# Patient Record
Sex: Male | Born: 1955 | Race: White | Hispanic: No | Marital: Married | Smoking: Never smoker
Health system: Southern US, Community
[De-identification: ages and names within clinical notes are randomized; demographics above are authoritative.]

## PROBLEM LIST (undated history)

## (undated) DIAGNOSIS — E785 Hyperlipidemia, unspecified: Secondary | ICD-10-CM

## (undated) DIAGNOSIS — F900 Attention-deficit hyperactivity disorder, predominantly inattentive type: Secondary | ICD-10-CM

## (undated) DIAGNOSIS — J339 Nasal polyp, unspecified: Secondary | ICD-10-CM

## (undated) DIAGNOSIS — M1990 Unspecified osteoarthritis, unspecified site: Secondary | ICD-10-CM

## (undated) DIAGNOSIS — F419 Anxiety disorder, unspecified: Secondary | ICD-10-CM

## (undated) HISTORY — DX: Hyperlipidemia, unspecified: E78.5

## (undated) HISTORY — DX: Attention-deficit hyperactivity disorder, predominantly inattentive type: F90.0

## (undated) HISTORY — DX: Nasal polyp, unspecified: J33.9

## (undated) HISTORY — DX: Anxiety disorder, unspecified: F41.9

## (undated) HISTORY — DX: Unspecified osteoarthritis, unspecified site: M19.90

---
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
## 1999-09-03 HISTORY — PX: INGUINAL HERNIA REPAIR: SUR1180

## 2001-11-08 ENCOUNTER — Encounter: Payer: Self-pay | Admitting: Internal Medicine

## 2001-11-30 ENCOUNTER — Ambulatory Visit (HOSPITAL_BASED_OUTPATIENT_CLINIC_OR_DEPARTMENT_OTHER): Admission: RE | Admit: 2001-11-30 | Discharge: 2001-11-30 | Payer: Self-pay | Admitting: Internal Medicine

## 2004-09-02 ENCOUNTER — Ambulatory Visit: Payer: Self-pay | Admitting: Family Medicine

## 2004-11-11 ENCOUNTER — Ambulatory Visit: Payer: Self-pay | Admitting: Internal Medicine

## 2005-05-14 ENCOUNTER — Ambulatory Visit: Payer: Self-pay | Admitting: Internal Medicine

## 2005-05-27 ENCOUNTER — Ambulatory Visit: Payer: Self-pay | Admitting: Internal Medicine

## 2005-08-21 ENCOUNTER — Ambulatory Visit: Payer: Self-pay | Admitting: Internal Medicine

## 2005-11-13 ENCOUNTER — Ambulatory Visit: Payer: Self-pay | Admitting: Internal Medicine

## 2006-05-21 ENCOUNTER — Ambulatory Visit: Payer: Self-pay | Admitting: Internal Medicine

## 2006-08-07 ENCOUNTER — Emergency Department (HOSPITAL_COMMUNITY): Admission: EM | Admit: 2006-08-07 | Discharge: 2006-08-07 | Payer: Self-pay | Admitting: Family Medicine

## 2006-10-28 ENCOUNTER — Ambulatory Visit: Payer: Self-pay | Admitting: Internal Medicine

## 2006-11-25 ENCOUNTER — Ambulatory Visit: Payer: Self-pay | Admitting: Internal Medicine

## 2006-11-25 LAB — CONVERTED CEMR LAB
Cholesterol: 250 mg/dL (ref 0–200)
Direct LDL: 172.6 mg/dL
Glucose, Bld: 93 mg/dL (ref 70–99)
HDL: 50.3 mg/dL (ref >39.0)
PSA: 1.85 ng/mL (ref 0.10–4.00)
Total CHOL/HDL Ratio: 5
Triglycerides: 119 mg/dL (ref 0–149)
VLDL: 24 mg/dL (ref 0–40)

## 2007-04-13 ENCOUNTER — Telehealth (INDEPENDENT_AMBULATORY_CARE_PROVIDER_SITE_OTHER): Payer: Self-pay | Admitting: *Deleted

## 2007-05-23 DIAGNOSIS — F411 Generalized anxiety disorder: Secondary | ICD-10-CM | POA: Insufficient documentation

## 2007-05-23 DIAGNOSIS — M129 Arthropathy, unspecified: Secondary | ICD-10-CM

## 2007-05-23 DIAGNOSIS — M1990 Unspecified osteoarthritis, unspecified site: Secondary | ICD-10-CM | POA: Insufficient documentation

## 2007-05-23 DIAGNOSIS — F988 Other specified behavioral and emotional disorders with onset usually occurring in childhood and adolescence: Secondary | ICD-10-CM | POA: Insufficient documentation

## 2007-05-23 DIAGNOSIS — J330 Polyp of nasal cavity: Secondary | ICD-10-CM | POA: Insufficient documentation

## 2007-07-11 ENCOUNTER — Telehealth (INDEPENDENT_AMBULATORY_CARE_PROVIDER_SITE_OTHER): Payer: Self-pay | Admitting: *Deleted

## 2007-07-20 ENCOUNTER — Ambulatory Visit: Payer: Self-pay | Admitting: Internal Medicine

## 2007-07-20 DIAGNOSIS — J019 Acute sinusitis, unspecified: Secondary | ICD-10-CM

## 2007-08-29 ENCOUNTER — Telehealth (INDEPENDENT_AMBULATORY_CARE_PROVIDER_SITE_OTHER): Payer: Self-pay | Admitting: *Deleted

## 2007-10-28 ENCOUNTER — Ambulatory Visit: Payer: Self-pay | Admitting: Family Medicine

## 2007-11-28 ENCOUNTER — Telehealth (INDEPENDENT_AMBULATORY_CARE_PROVIDER_SITE_OTHER): Payer: Self-pay | Admitting: *Deleted

## 2007-12-05 ENCOUNTER — Ambulatory Visit: Payer: Self-pay | Admitting: Internal Medicine

## 2007-12-06 LAB — CONVERTED CEMR LAB
Cholesterol: 230 mg/dL (ref 0–200)
Direct LDL: 158.3 mg/dL
Glucose, Bld: 98 mg/dL (ref 70–99)
HDL: 54.5 mg/dL (ref >39.0)
PSA: 2.46 ng/mL (ref 0.10–4.00)
Total CHOL/HDL Ratio: 4.2
Triglycerides: 85 mg/dL (ref 0–149)
VLDL: 17 mg/dL (ref 0–40)

## 2008-03-05 ENCOUNTER — Telehealth (INDEPENDENT_AMBULATORY_CARE_PROVIDER_SITE_OTHER): Payer: Self-pay | Admitting: *Deleted

## 2008-06-06 ENCOUNTER — Telehealth (INDEPENDENT_AMBULATORY_CARE_PROVIDER_SITE_OTHER): Payer: Self-pay | Admitting: *Deleted

## 2008-09-05 ENCOUNTER — Telehealth: Payer: Self-pay | Admitting: Internal Medicine

## 2008-09-24 ENCOUNTER — Ambulatory Visit: Payer: Self-pay | Admitting: Family Medicine

## 2008-09-24 DIAGNOSIS — J069 Acute upper respiratory infection, unspecified: Secondary | ICD-10-CM | POA: Insufficient documentation

## 2008-10-19 ENCOUNTER — Ambulatory Visit: Payer: Self-pay | Admitting: Family Medicine

## 2008-11-13 ENCOUNTER — Ambulatory Visit: Payer: Self-pay | Admitting: Internal Medicine

## 2008-12-04 ENCOUNTER — Telehealth: Payer: Self-pay | Admitting: Internal Medicine

## 2008-12-21 ENCOUNTER — Telehealth: Payer: Self-pay | Admitting: Family Medicine

## 2008-12-28 ENCOUNTER — Telehealth: Payer: Self-pay | Admitting: Internal Medicine

## 2009-03-06 ENCOUNTER — Telehealth: Payer: Self-pay | Admitting: Internal Medicine

## 2009-05-28 ENCOUNTER — Telehealth: Payer: Self-pay | Admitting: Internal Medicine

## 2009-07-17 ENCOUNTER — Ambulatory Visit: Payer: Self-pay | Admitting: Family Medicine

## 2009-10-13 ENCOUNTER — Telehealth: Payer: Self-pay | Admitting: Family Medicine

## 2010-01-17 ENCOUNTER — Ambulatory Visit: Payer: Self-pay | Admitting: Internal Medicine

## 2010-01-17 ENCOUNTER — Telehealth: Payer: Self-pay | Admitting: Internal Medicine

## 2010-05-13 ENCOUNTER — Telehealth (INDEPENDENT_AMBULATORY_CARE_PROVIDER_SITE_OTHER): Payer: Self-pay | Admitting: *Deleted

## 2010-05-14 ENCOUNTER — Ambulatory Visit: Payer: Self-pay | Admitting: Internal Medicine

## 2010-05-15 LAB — CONVERTED CEMR LAB
ALT: 30 units/L (ref 0–53)
AST: 23 units/L (ref 0–37)
Albumin: 3.9 g/dL (ref 3.5–5.2)
Alkaline Phosphatase: 40 units/L (ref 39–117)
BUN: 16 mg/dL (ref 6–23)
Basophils Absolute: 0 10*3/uL (ref 0.0–0.1)
Basophils Relative: 0.6 % (ref 0.0–3.0)
Bilirubin, Direct: 0.1 mg/dL (ref 0.0–0.3)
CO2: 30 meq/L (ref 19–32)
Calcium: 9.2 mg/dL (ref 8.4–10.5)
Chloride: 103 meq/L (ref 96–112)
Cholesterol: 227 mg/dL — ABNORMAL HIGH (ref 0–200)
Creatinine, Ser: 0.9 mg/dL (ref 0.4–1.5)
Direct LDL: 149 mg/dL
Eosinophils Absolute: 0.5 10*3/uL (ref 0.0–0.7)
Eosinophils Relative: 8.1 % — ABNORMAL HIGH (ref 0.0–5.0)
GFR calc non Af Amer: 90.35 mL/min (ref >60)
Glucose, Bld: 90 mg/dL (ref 70–99)
HCT: 45.7 % (ref 39.0–52.0)
HDL: 55.9 mg/dL (ref >39.00)
Hemoglobin: 15.5 g/dL (ref 13.0–17.0)
Lymphocytes Relative: 29 % (ref 12.0–46.0)
Lymphs Abs: 1.9 10*3/uL (ref 0.7–4.0)
MCHC: 34 g/dL (ref 30.0–36.0)
MCV: 92.2 fL (ref 78.0–100.0)
Monocytes Absolute: 0.4 10*3/uL (ref 0.1–1.0)
Monocytes Relative: 5.9 % (ref 3.0–12.0)
Neutro Abs: 3.6 10*3/uL (ref 1.4–7.7)
Neutrophils Relative %: 56.4 % (ref 43.0–77.0)
PSA: 2.08 ng/mL (ref 0.10–4.00)
Phosphorus: 2.9 mg/dL (ref 2.3–4.6)
Platelets: 185 10*3/uL (ref 150.0–400.0)
Potassium: 4.4 meq/L (ref 3.5–5.1)
RBC: 4.96 M/uL (ref 4.22–5.81)
RDW: 12.8 % (ref 11.5–14.6)
Sodium: 140 meq/L (ref 135–145)
TSH: 1.82 microintl units/mL (ref 0.35–5.50)
Total Bilirubin: 0.9 mg/dL (ref 0.3–1.2)
Total CHOL/HDL Ratio: 4
Total Protein: 6.4 g/dL (ref 6.0–8.3)
Triglycerides: 129 mg/dL (ref 0.0–149.0)
VLDL: 25.8 mg/dL (ref 0.0–40.0)
WBC: 6.5 10*3/uL (ref 4.5–10.5)

## 2010-05-22 ENCOUNTER — Ambulatory Visit: Payer: Self-pay | Admitting: Internal Medicine

## 2010-05-22 DIAGNOSIS — B356 Tinea cruris: Secondary | ICD-10-CM

## 2010-05-22 DIAGNOSIS — B351 Tinea unguium: Secondary | ICD-10-CM

## 2010-05-22 DIAGNOSIS — D2370 Other benign neoplasm of skin of unspecified lower limb, including hip: Secondary | ICD-10-CM

## 2010-06-24 ENCOUNTER — Encounter (INDEPENDENT_AMBULATORY_CARE_PROVIDER_SITE_OTHER): Payer: Self-pay | Admitting: *Deleted

## 2010-07-23 ENCOUNTER — Ambulatory Visit: Payer: Self-pay | Admitting: Internal Medicine

## 2010-07-24 LAB — CONVERTED CEMR LAB
ALT: 27 units/L (ref 0–53)
AST: 23 units/L (ref 0–37)
Albumin: 3.8 g/dL (ref 3.5–5.2)
Alkaline Phosphatase: 44 units/L (ref 39–117)
Bilirubin, Direct: 0.1 mg/dL (ref 0.0–0.3)
Total Bilirubin: 0.5 mg/dL (ref 0.3–1.2)
Total Protein: 6.1 g/dL (ref 6.0–8.3)

## 2010-09-19 ENCOUNTER — Encounter: Payer: Self-pay | Admitting: Gastroenterology

## 2010-09-22 ENCOUNTER — Ambulatory Visit: Payer: Self-pay | Admitting: Internal Medicine

## 2010-09-22 DIAGNOSIS — IMO0001 Reserved for inherently not codable concepts without codable children: Secondary | ICD-10-CM | POA: Insufficient documentation

## 2010-09-23 LAB — CONVERTED CEMR LAB
Basophils Absolute: 0 10*3/uL (ref 0.0–0.1)
Basophils Relative: 0.3 % (ref 0.0–3.0)
Eosinophils Absolute: 0.5 10*3/uL (ref 0.0–0.7)
Eosinophils Relative: 6.6 % — ABNORMAL HIGH (ref 0.0–5.0)
HCT: 41.9 % (ref 39.0–52.0)
Hemoglobin: 14.4 g/dL (ref 13.0–17.0)
Lymphocytes Relative: 28.5 % (ref 12.0–46.0)
Lymphs Abs: 2.1 10*3/uL (ref 0.7–4.0)
MCHC: 34.3 g/dL (ref 30.0–36.0)
MCV: 91.3 fL (ref 78.0–100.0)
Monocytes Absolute: 0.4 10*3/uL (ref 0.1–1.0)
Monocytes Relative: 6 % (ref 3.0–12.0)
Neutro Abs: 4.3 10*3/uL (ref 1.4–7.7)
Neutrophils Relative %: 58.6 % (ref 43.0–77.0)
Platelets: 207 10*3/uL (ref 150.0–400.0)
RBC: 4.59 M/uL (ref 4.22–5.81)
RDW: 13.1 % (ref 11.5–14.6)
Sed Rate: 10 mm/hr (ref 0–22)
Total CK: 102 units/L (ref 7–232)
WBC: 7.3 10*3/uL (ref 4.5–10.5)

## 2010-10-21 ENCOUNTER — Encounter (INDEPENDENT_AMBULATORY_CARE_PROVIDER_SITE_OTHER): Payer: Self-pay | Admitting: *Deleted

## 2010-10-24 ENCOUNTER — Ambulatory Visit: Payer: Self-pay | Admitting: Gastroenterology

## 2010-11-04 ENCOUNTER — Ambulatory Visit
Admission: RE | Admit: 2010-11-04 | Discharge: 2010-11-04 | Payer: Self-pay | Source: Home / Self Care | Attending: Family Medicine | Admitting: Family Medicine

## 2010-11-04 ENCOUNTER — Encounter (INDEPENDENT_AMBULATORY_CARE_PROVIDER_SITE_OTHER): Payer: Self-pay | Admitting: *Deleted

## 2010-11-04 ENCOUNTER — Other Ambulatory Visit: Payer: Self-pay | Admitting: Family Medicine

## 2010-11-04 LAB — SEDIMENTATION RATE: Sed Rate: 11 mm/hr (ref 0–22)

## 2010-11-04 LAB — CK: Total CK: 78 U/L (ref 7–232)

## 2010-11-07 ENCOUNTER — Ambulatory Visit: Admit: 2010-11-07 | Payer: Self-pay | Admitting: Gastroenterology

## 2010-11-11 ENCOUNTER — Telehealth: Payer: Self-pay | Admitting: Family Medicine

## 2010-12-02 NOTE — Assessment & Plan Note (Signed)
Summary: YEARLY PHYSICAL/JRR   Vital Signs:  Patient profile:   55 year old male Weight:      198.38 pounds BMI:     28.77 Temp:     98.4 degrees F oral Pulse rate:   72 / minute Pulse rhythm:   regular BP sitting:   106 / 66  (left arm) Cuff size:   large  Vitals Entered By: Sydell Axon LPN (May 22, 2010 2:18 PM) CC: 30 Minute checkup   History of Present Illness: Doing well Has lost 30# by dietary management and aggressive exercise regimen Reviewed his lab work chol is down for him with this regimen  Still fine off his other meds Anxiety has not been a sig problem No work performance issues without the stimulant  Allergies have been okay uses flonase when needed  Allergies: 1)  ! * Pine Nuts  Past History:  Past medical, surgical, family and social histories (including risk factors) reviewed for relevance to current acute and chronic problems.  Past Medical History: Reviewed history from 05/23/2007 and no changes required. Anxiety Hyperlipidemia ADHD--inattentive Osteoarthritis Nasal polyps  Past Surgical History: Reviewed history from 05/23/2007 and no changes required. [facility] (Dr. Reuel Boom) 09/1999 Sleep study, negative 01/03 L knee cartilage removal 1973 Right knee arthroscopy 1974  Family History: Reviewed history from 12/05/2007 and no changes required. Mom and Dad both alcoholic Twin sister and younger sister CAD on both sides No HTN Mat GM had DM No prostate or colon cancer  Social History: Reviewed history from 05/23/2007 and no changes required. Marital Status: Married Children: 3 Occupation: IT sales professional Never Smoked  Review of Systems General:  occ sleep problems--nothing major does note tiredness at times wears seat belt. Eyes:  Denies double vision and vision loss-1 eye. ENT:  Complains of decreased hearing; denies ringing in ears; stable hearing loss teeth fine--regular with dentist. CV:  Denies chest pain or  discomfort, difficulty breathing at night, difficulty breathing while lying down, fainting, lightheadness, palpitations, and shortness of breath with exertion. Resp:  Denies cough and shortness of breath. GI:  Complains of change in bowel habits; denies abdominal pain, bloody stools, dark tarry stools, indigestion, nausea, and vomiting; bowels down to every other day--with reduction in calories. GU:  Denies erectile dysfunction, nocturia, urinary frequency, and urinary hesitancy. MS:  Complains of joint pain; denies joint swelling; chronic knee pain--not limiting. Derm:  Complains of lesion(s) and rash; rash and groin rash in hot weather---used antifungal cream with success in past Still with toenail fungus  Has persistent nodule on left calf that is bothersome. Neuro:  Denies headaches, numbness, tingling, and weakness. Psych:  Denies anxiety and depression. Heme:  Denies abnormal bruising and enlarge lymph nodes. Allergy:  Complains of seasonal allergies and sneezing; meds control reasonably.  Physical Exam  General:  alert and normal appearance.   Eyes:  pupils equal, pupils round, pupils reactive to light, and no optic disk abnormalities.   Ears:  R ear normal and L ear normal.   Mouth:  no erythema, no exudates, and no lesions.   Neck:  supple, no masses, no thyromegaly, no carotid bruits, and no cervical lymphadenopathy.   Lungs:  normal respiratory effort, no intercostal retractions, no accessory muscle use, and normal breath sounds.   Heart:  normal rate, regular rhythm, no murmur, and no gallop.   Abdomen:  soft, non-tender, and no masses.   Rectal:  no hemorrhoids and no masses.   Prostate:  no gland enlargement and no  nodules.   Msk:  no joint tenderness and no joint swelling.   Pulses:  normal in feet Extremities:  no edema Neurologic:  alert & oriented X3, strength normal in all extremities, and gait normal.   Skin:  onchymycosis 7/10 toenails mild crural rash Axillary  Nodes:  No palpable lymphadenopathy Psych:  normally interactive, good eye contact, not anxious appearing, and not depressed appearing.     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 1 tab daily for 3 months for toenail fungus    Ketoconazole 2 % Crea (Ketoconazole) .Marland Kitchen... Apply to rash two times a day till clear  Problem # 4:  ANXIETY (ICD-300.00) Assessment: Improved continues to do well without Rx  Problem # 5:  HYPERLIPIDEMIA, MOD. (ICD-272.4) Assessment: Comment Only great work on lifestyle  Labs Reviewed: SGOT: 23 (05/14/2010)   SGPT: 30 (05/14/2010)   HDL:55.90 (05/14/2010), 54.5 (12/05/2007)  LDL:DEL (12/05/2007), DEL (11/25/2006)  Chol:227 (05/14/2010), 230 (12/05/2007)  Trig:129.0 (05/14/2010), 85 (12/05/2007)  Complete Medication List: 1)  Epipen 2-pak 0.3 Mg/0.63ml (1:1000) Devi (Epinephrine hcl (anaphylaxis)) .... Inject 1 pen subcutaneously as directed 2)  Fluticasone Propionate 50 Mcg/act Susp (Fluticasone propionate) .... 2 sprays each nostril daily 3)  Terbinafine Hcl 250 Mg Tabs (Terbinafine hcl) .Marland Kitchen.. 1 tab daily for 3 months for toenail fungus 4)  Ketoconazole 2 % Crea (Ketoconazole) .... Apply to rash two times a day till clear  Other Orders: Gastroenterology Referral (GI)  Patient Instructions: 1)  Please schedule a follow-up appointment in 1 year.  2)  Please set up blood work in about 1 month (hepatic) 3)  Schedule a colonoscopy/ sigmoidoscopy to help detect colon cancer.  Prescriptions: KETOCONAZOLE 2 % CREA (KETOCONAZOLE) apply to rash  two times a day till clear  #60gm x 3   Entered and Authorized by:   Cindee Salt MD   Signed by:   Cindee Salt MD on 05/22/2010   Method used:   Electronically to        [facility] (retail)       [address]       [place], [place]  [address]       Ph: [phone]       Fax: [phone]   RxID:   [id number] TERBINAFINE HCL 250 MG TABS (TERBINAFINE HCL) 1 tab daily for 3 months for toenail fungus  #30 x 2   Entered and Authorized by:   Cindee Salt MD   Signed by:   Cindee Salt MD on 05/22/2010   Method used:   Electronically to        [facility] (retail)       [address]       [place], [place]  [address]       Ph: [phone]       Fax: [phone]   RxID:   [id number] EPIPEN 2-PAK 0.3 MG/0.3ML (1:1000) DEVI (EPINEPHRINE HCL (ANAPHYLAXIS)) Inject 1 pen subcutaneously  as directed  #2 x 1   Entered by:   Sydell Axon LPN   Authorized by:   Cindee Salt MD   Signed by:   Sydell Axon LPN on 21/30/8657   Method used:   Electronically to        [facility] (retail)       [address]       [place], [place]  [address]       Ph: [phone]       Fax: [phone]   RxID:   [id number]   Current Allergies (reviewed today): ! * PINE NUTS  Appended Document: YEARLY PHYSICAL/JRR     Allergies: 1)  ! * Pine Nuts   Impression & Recommendations:  Problem # 1:  DERMATOFIBROMA, LEG (ICD-216.7) Assessment New  liquid nitrogen treatment 40 seconds x 2 tolerated well  Orders: Cryotherapy/Destruction benign or premalignant lesion (1st lesion)  ([address])  Complete Medication List: 1)  Epipen 2-pak 0.3 Mg/0.69ml (1:1000) Devi (Epinephrine hcl (anaphylaxis)) .... Inject 1 pen subcutaneously as directed 2)  Fluticasone Propionate 50 Mcg/act Susp (Fluticasone propionate) .... 2 sprays  each nostril daily 3)  Terbinafine Hcl 250 Mg Tabs (Terbinafine hcl) .Marland Kitchen.. 1 tab daily for 3 months for toenail fungus 4)  Ketoconazole 2 % Crea (Ketoconazole) .... Apply to rash two times a day till clear

## 2010-12-02 NOTE — Letter (Signed)
Summary: [place] No Show Letter  [place] at [facility]  [address] [place], [place] [address]   Phone: [phone]  Fax: [phone]    06/24/2010 MRN: [id number]  Alexander Cohen [address] [place], [place]  [address]   Dear Alexander Cohen,   Our records indicate that you missed your scheduled appointment with ____lab_________________ on _8.22.11__________.  Please contact this office to reschedule your appointment as soon as possible.  It is important that you keep your scheduled appointments with your physician, so we can provide you the best care possible.  Please be advised that there may be a charge for "no show" appointments.    Sincerely,   [place] at [facility]

## 2010-12-02 NOTE — Progress Notes (Signed)
Summary: Need Rx's sent to Pharmacy  Phone Note Call from Patient   Caller: Patient Call For: Alexander Salt MD Summary of Call: Pt left Rx in the office today, need them sent to [facility] in [place]... Augmentin-Pot Clavalunate875-125mg .Daine Gip  January 17, 2010 2:51 PM Initial call taken by: Daine Gip,  January 17, 2010 2:51 PM  Follow-up for Phone Call        rx sent to pharmacy Follow-up by: Mervin Hack CMA Duncan Dull),  January 17, 2010 3:10 PM    Prescriptions: AMOXICILLIN-POT CLAVULANATE 875-125 MG TABS (AMOXICILLIN-POT CLAVULANATE) 1 by mouth two times a day with food for sinus infection  #20 x 1   Entered by:   Mervin Hack CMA (AAMA)   Authorized by:   Alexander Salt MD   Signed by:   Mervin Hack CMA (AAMA) on 01/17/2010   Method used:   Electronically to        [facility] (retail)       [address]       [place], [place]  [address]       Ph: [phone]       Fax: [phone]   RxID:   [id number]

## 2010-12-02 NOTE — Assessment & Plan Note (Signed)
Summary: UPPER ARMS,HIP PAIN/CLE   Vital Signs:  Patient profile:   55 year old male Weight:      191 pounds Temp:     97.7 degrees F oral BP sitting:   108 / 60  (left arm) Cuff size:   large  Vitals Entered By: Mervin Hack CMA Duncan Dull) (September 22, 2010 12:45 PM) CC: shoulder and hip pain x1week   History of Present Illness: In "terrible pain" Arms by shoulders and in hips Hard to even gets his shirts on  doesn't remember any injury No loss of strength but pain is limiting trying to continue his regular regimen  affecting his sleep Along lateral hips also Goes back 1-2 weeks  Has used ibuprofen but it really hasn't helped tried cream without effect slight help with hand held massager  Has been doing daily computer generated work out using weights daily but now stopped over the past 3 days also had been raking leaves  Allergies: 1)  ! * Pine Nuts  Past History:  Past medical, surgical, family and social histories (including risk factors) reviewed for relevance to current acute and chronic problems.  Past Medical History: Reviewed history from 05/23/2007 and no changes required. Anxiety Hyperlipidemia ADHD--inattentive Osteoarthritis Nasal polyps  Past Surgical History: Reviewed history from 05/23/2007 and no changes required. [facility] (Dr. Reuel Boom) 09/1999 Sleep study, negative 01/03 L knee cartilage removal 1973 Right knee arthroscopy 1974  Family History: Reviewed history from 12/05/2007 and no changes required. Mom and Dad both alcoholic Twin sister and younger sister CAD on both sides No HTN Mat GM had DM No prostate or colon cancer  Social History: Reviewed history from 05/23/2007 and no changes required. 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  NAD Head:  no temporal bruits Neck:  supple,  full ROM, and no masses.   Msk:  No tenderness or decrease in ROM at shoulders sensitive around triceps tendon area  hips have normal ROM no localized tenderness Neurologic:  alert & oriented X3, strength normal in all extremities, and gait normal.   Psych:  normally interactive, good eye contact, not anxious appearing, and not depressed appearing.     Impression & Recommendations:  Problem # 1:  MYALGIA (ICD-729.1) Assessment New  pattern concerning for PMR but he is on the young side may just have had strain from hard work out protocol  P: will just do cardio for now     tramadol to help sleep     check labs---consider prednisone trial if ESR elevated and no better in 1-2 weeks  His updated medication list for this problem includes:    Tramadol Hcl 50 Mg Tabs (Tramadol hcl) .Marland Kitchen... 1-2 tabs at bedtime as needed for severe pain  Orders: Venipuncture ([address]) TLB-CBC Platelet - w/Differential (85025-CBCD) TLB-CK Total Only(Creatine Kinase/CPK) (82550-CK) TLB-Sedimentation Rate (ESR) (85652-ESR)  Complete Medication List: 1)  Epipen 2-pak 0.3 Mg/0.75ml (1:1000) Devi (Epinephrine hcl (anaphylaxis)) .... Inject 1 pen subcutaneously as directed 2)  Fluticasone Propionate 50 Mcg/act Susp (Fluticasone propionate) .... 2 sprays each nostril daily 3)  Tramadol Hcl 50 Mg Tabs (Tramadol hcl) .Marland Kitchen.. 1-2 tabs at bedtime as needed for severe pain  Patient Instructions: 1)  Please call if the pain in your muscles is no better in 1-2 weeks 2)  Please schedule a follow-up appointment as needed .  Prescriptions:  TRAMADOL HCL 50 MG TABS (TRAMADOL HCL) 1-2 tabs at bedtime as needed for severe pain  #30 x 0   Entered and Authorized by:   Cindee Salt MD   Signed by:   Cindee Salt MD on 09/22/2010   Method used:   Electronically to        [facility] (retail)       [address]       [place], [place]  [address]       Ph:  [phone]       Fax: [phone]   RxID:   [id number]    Orders Added: 1)  Est. Patient Level IV [[address]] 2)  Venipuncture [[address]] 3)  TLB-CBC Platelet - w/Differential [85025-CBCD] 4)  TLB-CK Total Only(Creatine Kinase/CPK) [82550-CK] 5)  TLB-Sedimentation Rate (ESR) [85652-ESR]    Current Allergies (reviewed today): ! * PINE NUTS

## 2010-12-02 NOTE — Progress Notes (Signed)
----   Converted from flag ---- ---- 05/13/2010 10:34 AM, Cindee Salt MD wrote: Renal, CBC with diff, TSH---314.00 lipid, hepatic   -- 272.4 PSA--v76.44  ---- 05/13/2010 7:53 AM, Liane Comber CMA (AAMA) wrote: Pt is scheduled for cpx labs tomorrow, what labs to draw and dx codes? Thanks Tasha ------------------------------

## 2010-12-02 NOTE — Letter (Signed)
Summary: Pre Visit Letter Revised  [facility]  [address] [place], [place] [address]   Phone: [phone]  Fax: [phone]        09/19/2010 MRN: [id number]  Alexander Cohen [address] [place], [place]  [address]             Procedure Date:  11-07-10 8:30am           Dr  Christella Hartigan   Welcome to the Gastroenterology Division at [facility].    You are scheduled to see a nurse for your pre-procedure visit on 10-24-10 at 8:30am on the 3rd floor at [facility], [address]  We ask that you try to arrive at our office 15 minutes prior to your appointment time to allow for check-in.  Please take a minute to review the attached form.  If you answer "Yes" to one or more of the questions on the first page, we ask that you call the person listed at your earliest opportunity.  If you answer "No" to all of the questions, please complete the rest of the form and bring it to your appointment.    Your nurse visit will consist of discussing your medical and surgical history, your immediate family medical history, and your medications.   If you are unable to list all of your medications on the form, please bring the medication bottles to your appointment and we will list them.  We will need to be aware of both prescribed and over the counter drugs.  We will need to know exact dosage information as well.    Please be prepared to read and sign documents such as consent forms, a financial agreement, and acknowledgement forms.  If necessary, and with your consent, a friend or relative is welcome to sit-in on the nurse visit with you.  Please bring your insurance card so that we may make a copy of it.  If your insurance requires a referral to see a specialist, please bring your referral form from your primary care physician.  No co-pay is required for this nurse visit.     If you cannot keep your appointment, please call [phone] to cancel or reschedule prior to your  appointment date.  This allows [place] the opportunity to schedule an appointment for another patient in need of care.    Thank you for choosing [facility] for your medical needs.  We appreciate the opportunity to care for you.  Please visit [place] at our website  to learn more about our practice.  Sincerely, The Gastroenterology Division

## 2010-12-02 NOTE — Assessment & Plan Note (Signed)
Summary: CONGESTION,ST,DRAINAGE/CLE   Vital Signs:  Patient profile:   55 year old male Weight:      218 pounds O2 Sat:      97 % on Room air Temp:     98.3 degrees F oral Pulse rate:   97 / minute Pulse rhythm:   regular BP sitting:   110 / 62  (left arm) Cuff size:   large  Vitals Entered By: Mervin Hack CMA Duncan Dull) (January 17, 2010 12:34 PM)  O2 Flow:  Room air CC: congestion   History of Present Illness: Overall doing okay Stopped both buspar and concerta last summer--has done well  No job performance issues mild anxiety but nothing worrisome  sinus symtoms sore throat started bad yesterday Tried decongestant nasal spray---uses very sparingly with congestion Not really helping lots of illness around school  some facial pressure no fever Lots of PND cough with dark heavy mucus  Allergies: 1)  ! * Pine Nuts  Past History:  Past medical, surgical, family and social histories (including risk factors) reviewed for relevance to current acute and chronic problems.  Past Medical History: Reviewed history from 05/23/2007 and no changes required. Anxiety Hyperlipidemia ADHD--inattentive Osteoarthritis Nasal polyps  Past Surgical History: Reviewed history from 05/23/2007 and no changes required. [facility] (Dr. Reuel Boom) 09/1999 Sleep study, negative 01/03 L knee cartilage removal 1973 Right knee arthroscopy 1974  Family History: Reviewed history from 12/05/2007 and no changes required. Mom and Dad both alcoholic Twin sister and younger sister CAD on both sides No HTN Mat GM had DM No prostate or colon cancer  Social History: Reviewed history from 05/23/2007 and no changes required. 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  NAD Head:  no frontal or maxillary tenderness Ears:  R  ear normal and L ear normal.   Nose:  marked congestion with thick yellowish mucus--esp on right. ?small polyp on right Mouth:  no erythema and no exudates.   Neck:  supple, no masses, no thyromegaly, and no cervical lymphadenopathy.   Lungs:  normal respiratory effort, normal breath sounds, no crackles, and no wheezes.   Psych:  normally interactive, good eye contact, not anxious appearing, and not depressed appearing.     Impression & Recommendations:  Problem # 1:  SINUSITIS- ACUTE-NOS (ICD-461.9) Assessment Comment Only early in course discussed restarting his flonase if worsens, will start augmentin  Problem # 2:  ATTENTION DEFICIT DISORDER, INATTENTIVE TYPE (ICD-314.00) Assessment: Improved dong fine without the meds  Problem # 3:  ANXIETY (ICD-300.00) Assessment: Comment Only mild symptoms only without meds now  Complete Medication List: 1)  Epipen 2-pak 0.3 Mg/0.39ml (1:1000) Devi (Epinephrine hcl (anaphylaxis)) .... Inject 1 pen subcutaneously as directed 2)  Ambien 10 Mg Tabs (Zolpidem tartrate) .... Take 1 tablet by mouth at bedtime as needed. 3)  Fluticasone Propionate 50 Mcg/act Susp (Fluticasone propionate) .... 2 sprays each nostril daily 4)  Amoxicillin-pot Clavulanate 875-125 Mg Tabs (Amoxicillin-pot clavulanate) .Marland Kitchen.. 1 by mouth two times a day with food for sinus infection  Patient Instructions: 1)  Please schedule a follow-up appointment in 3-6  months for physical Prescriptions: AMOXICILLIN-POT CLAVULANATE 875-125 MG TABS (AMOXICILLIN-POT CLAVULANATE) 1 by mouth two times a day with food for sinus infection  #20 x 1   Entered and Authorized by:   Cindee Salt MD  Signed by:   Cindee Salt MD on 01/17/2010   Method used:   Print then Give to Patient   RxID:   [id number] FLUTICASONE PROPIONATE 50 MCG/ACT SUSP (FLUTICASONE PROPIONATE) 2 sprays each nostril daily  #1 x 12   Entered and Authorized by:   Cindee Salt MD   Signed by:    Cindee Salt MD on 01/17/2010   Method used:   Electronically to        [facility] (retail)       [address]       [place], [place]  [address]       Ph: [phone]       Fax: [phone]   RxID:   [id number] FLONASE 50 MCG/ACT  SUSP (FLUTICASONE PROPIONATE) Use two  sprays  in each nostril daily  #1 x 11   Entered by:   Mervin Hack CMA (AAMA)   Authorized by:   Cindee Salt MD   Signed by:   Mervin Hack CMA (AAMA) on 01/17/2010   Method used:   Electronically to        [facility] (retail)       [address]       [place], [place]  [address]       Ph: [phone]       Fax: [phone]   RxID:   [id number]   Current Allergies (reviewed today): ! * PINE NUTS

## 2010-12-04 NOTE — Assessment & Plan Note (Signed)
Summary: PAIN IN SHOULDERS AND LEGS/[facility]   Vital Signs:  Patient profile:   55 year old male Weight:      193.25 pounds Temp:     98.1 degrees F oral Pulse rate:   64 / minute Pulse rhythm:   regular BP sitting:   110 / 70  (right arm) Cuff size:   large  Vitals Entered By: Sydell Axon LPN (November 04, 2010 2:06 PM) CC: Pain in shoulders and legs   History of Present Illness: Similar to 11/11 symptoms.  ESR was not elevated.  Was not put on prednisone but did take the tramadol.  Backed off exercise in the meantime and pain has increased.  Strength is good in arms but has pain proximally.  Similar distribution in legs.  No help with aleve two times a day.  Tramadol didn't help at all.  "It's not in the joint."  He isn't weak.  No vision changes.  No HA.  No FCNAVDR or other systemic symptoms.  prev notes reviewed.   Allergies: 1)  ! * Pine Nuts  Social History: Reviewed history from 05/23/2007 and no changes required. 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  Otherwise negative.    Physical Exam  General:  no apparent distress normocephalic atraumatic mucous membranes moist neck supple regular rate and rhythm clear to auscultation bilaterally diffusely tender to palpation and with testing of proximal limb muscles, but not weak. dtrs wnl x4 and sensation intact.     Impression & Recommendations:  Problem # 1:  MYALGIA (ICD-729.1) Presumed PMR.  Will check labs and start prednisone.  See instructions.  I would tx with prednisone even if ESR is not elevated.  Pt understands and will call back with update.  We can determine steroid taper at that point..  No HA and no vision changes.  The following medications were removed from the medication list:    Tramadol Hcl 50 Mg Tabs (Tramadol hcl) .Marland Kitchen... 1-2 tabs at bedtime as needed for severe pain  Orders: Prescription Created Electronically  [phone]) Venipuncture ([address]) Specimen Handling ([address]) TLB-CK Total Only(Creatine Kinase/CPK) (82550-CK) TLB-Sedimentation Rate (ESR) (85652-ESR)  Complete Medication List: 1)  Epipen 2-pak 0.3 Mg/0.16ml (1:1000) Devi (Epinephrine hcl (anaphylaxis)) .... Inject 1 pen subcutaneously as directed 2)  Fluticasone Propionate 50 Mcg/act Susp (Fluticasone propionate) .... 2 sprays each nostril daily 3)  Moviprep 100 Gm Solr (Peg-kcl-nacl-nasulf-na asc-c) .... As per prep instructions. 4)  Prednisone 20 Mg Tabs (Prednisone) .Marland Kitchen.. 1 by mouth once daily with food.  Patient Instructions: 1)  I would start the prednisone today.  Take it with food.  Let us know in a few days if you are getting much better (or if you are getting worse).  If you don't have much of a change by next week, let us know.  We'll contact you with your lab report.  Prescriptions: PREDNISONE 20 MG TABS (PREDNISONE) 1 by mouth once daily with food.  #14 x 0   Entered and Authorized by:   Crawford Givens MD   Signed by:   Crawford Givens MD on 11/04/2010   Method used:   Electronically to        [facility] (retail)       [address]       [place], [place]  [address]  Ph: [phone]       Fax: [phone]   RxID:   [id number]    Orders Added: 1)  Est. Patient Level III [87564] 2)  Prescription Created Electronically [G8553] 3)  Venipuncture [33295] 4)  Specimen Handling [99000] 5)  TLB-CK Total Only(Creatine Kinase/CPK) [82550-CK] 6)  TLB-Sedimentation Rate (ESR) [85652-ESR]    Current Allergies (reviewed today): ! * PINE NUTS

## 2010-12-04 NOTE — Miscellaneous (Signed)
Summary: [facility]  Clinical Lists Changes  Medications: Added new medication of MOVIPREP 100 GM  SOLR (PEG-KCL-NACL-NASULF-NA ASC-C) As per prep instructions. - Signed Rx of MOVIPREP 100 GM  SOLR (PEG-KCL-NACL-NASULF-NA ASC-C) As per prep instructions.;  #1 x 0;  Signed;  Entered by: Durwin Glaze RN;  Authorized by: Rachael Fee MD;  Method used: Electronically to [facility] [address], [place], [place], [place]  [address], Ph: [phone], Fax: [phone] Observations: Added new observation of ALLERGY REV: Done (10/24/2010 8:26)    Prescriptions: MOVIPREP 100 GM  SOLR (PEG-KCL-NACL-NASULF-NA ASC-C) As per prep instructions.  #1 x 0   Entered by:   Durwin Glaze RN   Authorized by:   Rachael Fee MD   Signed by:   Durwin Glaze RN on 10/24/2010   Method used:   Electronically to        [facility] (retail)       [address]       [place], [place]  [address]       Ph: [phone]       Fax: [phone]   RxID:   [id number]

## 2010-12-04 NOTE — Letter (Signed)
Summary: Candi Leash and No Reschedule  [facility]  [address] [place], [place] [address]   Phone: [phone]  Fax: [phone]      November 04, 2010 MRN: [id number]   MATAIO MELE [address] [place], [place]  [address]     You recently cancelled your colonoscopy procedure at the [facility] and did not reschedule for another date.    Your provider recommended this procedure for the benefit of your health.  It is very important that you reschedule it.  Failure to do so may be to the detriment of your health.  Please call us at [phone] and we will be happy to assist you with rescheduling.    If you were referred for this procedure by another physician/provider, we will notify him/her that you did not keep your appointment.   Sincerely,  [facility]

## 2010-12-04 NOTE — Letter (Signed)
Summary: [facility] Instructions  Alexander Cohen  [address] [place], [place] [address]   Phone: [phone]  Fax: [phone]       Alexander Cohen    07-13-1956    MRN: [id number]        Procedure Day [name]:  Alexander Cohen  [birth year]     Arrival Time:  7:30am     Procedure Time:  8:30am     Location of Procedure:                    Alexander Cohen  [facility] (4th Floor)                       PREPARATION FOR COLONOSCOPY WITH MOVIPREP   Starting 5 days prior to your procedure  SUNDAY 01/01  do not eat nuts, seeds, popcorn, corn, beans, peas,  salads, or any raw vegetables.  Do not take any fiber supplements (e.g. Metamucil, Citrucel, and Benefiber).  THE DAY BEFORE YOUR PROCEDURE         DATE:  THURSDAY  01/05  1.  Drink clear liquids the entire day-NO SOLID FOOD  2.  Do not drink anything colored red or purple.  Avoid juices with pulp.  No orange juice.  3.  Drink at least 64 oz. (8 glasses) of fluid/clear liquids during the day to prevent dehydration and help the prep work efficiently.  CLEAR LIQUIDS INCLUDE: Water Jello Ice Popsicles Tea (sugar ok, no milk/cream) Powdered fruit flavored drinks Coffee (sugar ok, no milk/cream) Gatorade Juice: apple, white grape, white cranberry  Lemonade Clear bullion, consomm, broth Carbonated beverages (any kind) Strained chicken noodle soup Hard Candy                             4.  In the morning, mix first dose of MoviPrep solution:    Empty 1 Pouch A and 1 Pouch B into the disposable container    Add lukewarm drinking water to the top line of the container. Mix to dissolve    Refrigerate (mixed solution should be used within 24 hrs)  5.  Begin drinking the prep at 5:00 p.m. The MoviPrep container is divided by 4 marks.   Every 15 minutes drink the solution down to the next mark (approximately 8 oz) until the full liter is complete.   6.  Follow completed prep with 16 oz of clear liquid of your choice  (Nothing red or purple).  Continue to drink clear liquids until bedtime.  7.  Before going to bed, mix second dose of MoviPrep solution:    Empty 1 Pouch A and 1 Pouch B into the disposable container    Add lukewarm drinking water to the top line of the container. Mix to dissolve    Refrigerate  THE DAY OF YOUR PROCEDURE      DATE:  FRIDAY  01/57  Beginning at  3:30 a.m. (5 hours before procedure):         1. Every 15 minutes, drink the solution down to the next mark (approx 8 oz) until the full liter is complete.  2. Follow completed prep with 16 oz. of clear liquid of your choice.    3. You may drink clear liquids until  6:30am  (2 HOURS BEFORE PROCEDURE).   MEDICATION INSTRUCTIONS  Unless otherwise instructed, you should take regular prescription medications with a small sip of water  as early as possible the morning of your procedure.           OTHER INSTRUCTIONS  You will need a responsible adult at least 57 years of age to accompany you and drive you home.   This person must remain in the waiting room during your procedure.  Wear loose fitting clothing that is easily removed.  Leave jewelry and other valuables at home.  However, you may wish to bring a book to read or  an iPod/MP3 player to listen to music as you wait for your procedure to start.  Remove all body piercing jewelry and leave at home.  Total time from sign-in until discharge is approximately 2-3 hours.  You should go home directly after your procedure and rest.  You can resume normal activities the  day after your procedure.  The day of your procedure you should not:   Drive   Make legal decisions   Operate machinery   Drink alcohol   Return to work  You will receive specific instructions about eating, activities and medications before you leave.    The above instructions have been reviewed and explained to me by   Durwin Glaze RN  December 23, [birth year] 8:56 AM    I fully understand  and can verbalize these instructions _____________________________ Date _________

## 2010-12-04 NOTE — Progress Notes (Signed)
Summary: pt is doing better  Phone Note Call from Patient Call back at Home Phone [phone]   Caller: Patient Summary of Call: Pt called to report that prednisone is working, his legs are better and his arms are still hurting but not as bad.  He is having no problems with the prednisone.              Lowella Petties CMA, AAMA  November 11, 2010 3:30 PM   Follow-up for Phone Call        I called patient.  I also talked with Dr. Alphonsus Sias.  Pt tolerating prednisone and feeling better.  GI precaution given on steroids.  Will continue prednisone for now and have patient follow up with Dr. Alphonsus Sias in about 1 month.  They can talk about taper at that point.  please call patient to set him up with Dr. Alphonsus Sias in 1 month.  I sent in the rx.  please tell him I sent the rx to [facility].  Follow-up by: Crawford Givens MD,  November 11, 2010 5:11 PM  Additional Follow-up for Phone Call Additional follow up Details #1::        Left message on voicemail  in detail.  Personalized VM.   Call to set up appt. with Alphonsus Sias tomorrow. Delilah Shan CMA Ayisha Pol Dull)  November 11, 2010 5:18 PM   Appointment scheduled  12/12/2010 with Dr. Alphonsus Sias. Additional Follow-up by: Delilah Shan CMA (AAMA),  November 12, 2010 10:10 AM    Prescriptions: PREDNISONE 20 MG TABS (PREDNISONE) 1 by mouth once daily with food.  #30 x 1   Entered and Authorized by:   Crawford Givens MD   Signed by:   Crawford Givens MD on 11/11/2010   Method used:   Electronically to        [facility] (retail)       [address]       [place], [place]  [address]       Ph: [phone]       Fax: [phone]   RxID:   [id number]

## 2010-12-12 ENCOUNTER — Ambulatory Visit (INDEPENDENT_AMBULATORY_CARE_PROVIDER_SITE_OTHER): Payer: BC Managed Care – PPO | Admitting: Internal Medicine

## 2010-12-12 ENCOUNTER — Encounter: Payer: Self-pay | Admitting: Internal Medicine

## 2010-12-12 DIAGNOSIS — IMO0001 Reserved for inherently not codable concepts without codable children: Secondary | ICD-10-CM

## 2010-12-18 NOTE — Assessment & Plan Note (Signed)
Summary: 1 MONTH FOLLOW UP (PREDNISONE)LSF   Vital Signs:  Patient profile:   55 year old male Weight:      193 pounds Temp:     97.8 degrees F oral Pulse rate:   71 / minute Pulse rhythm:   regular BP sitting:   120 / 73  (left arm) Cuff size:   large  Vitals Entered By: Mervin Hack CMA Duncan Dull) (December 12, 2010 2:52 PM) CC: follow-up   History of Present Illness: Has really noted a difference with the prednisone Pain is pretty much gone   Has been working back up to a full exercise schedule as in the past  discussed protein intake He doesn't have much--1 serving most days only  No weakness  Allergies: 1)  ! * Pine Nuts  Past History:  Past medical, surgical, family and social histories (including risk factors) reviewed for relevance to current acute and chronic problems.  Past Medical History: Reviewed history from 05/23/2007 and no changes required. Anxiety Hyperlipidemia ADHD--inattentive Osteoarthritis Nasal polyps  Past Surgical History: Reviewed history from 05/23/2007 and no changes required. [facility] (Dr. Reuel Boom) 09/1999 Sleep study, negative 01/03 L knee cartilage removal 1973 Right knee arthroscopy 1974  Family History: Reviewed history from 12/05/2007 and no changes required. Mom and Dad both alcoholic Twin sister and younger sister CAD on both sides No HTN Mat GM had DM No prostate or colon cancer  Social History: Reviewed history from 05/23/2007 and no changes required. 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   Psych:  normally interactive, good eye contact, not anxious appearing, and not depressed appearing.     Impression & Recommendations:  Problem # 1:  MYALGIA (ICD-729.1) Assessment Improved could be PMR with normal sed rate  but also could be overaggressive work out regimen with inadequate protein stores  will wean the prednisone try creatine supplements counselled most of 15 minute visit  Complete Medication List: 1)  Epipen 2-pak 0.3 Mg/0.44ml (1:1000) Devi (Epinephrine hcl (anaphylaxis)) .... Inject 1 pen subcutaneously as directed 2)  Fluticasone Propionate 50 Mcg/act Susp (Fluticasone propionate) .... 2 sprays each nostril daily 3)  Prednisone 10 Mg Tabs (Prednisone) .Marland Kitchen.. 1 tab daily and wean as directed  Patient Instructions: 1)  Please start creatine supplements. 2)  Reduce the prednisone to 10mg  daily. In 2 weeks, go down to 10mg  on even days and 5mg  on odd days. In 1 month, go down to 10mg  on even days and none on odd days, In 6 weeks, go down to 5mg  on even days only and then stop in 2 months 3)  Please schedule a follow-up appointment in 3 months .  Prescriptions: PREDNISONE 10 MG TABS (PREDNISONE) 1 tab daily and wean as directed  #100 x 0   Entered and Authorized by:   Cindee Salt MD   Signed by:   Cindee Salt MD on 12/12/2010   Method used:   Electronically to        [facility] (retail)       [address]       [place], [place]  [address]       Ph: [phone]       Fax: [phone]   RxID:  [id number]    Orders Added: 1)  Est. Patient Level III [14782]    Current Allergies (reviewed today): ! * PINE NUTS

## 2011-01-12 ENCOUNTER — Encounter: Payer: Self-pay | Admitting: Internal Medicine

## 2011-03-12 ENCOUNTER — Ambulatory Visit (INDEPENDENT_AMBULATORY_CARE_PROVIDER_SITE_OTHER): Payer: BC Managed Care – PPO | Admitting: Internal Medicine

## 2011-03-12 ENCOUNTER — Encounter: Payer: Self-pay | Admitting: Internal Medicine

## 2011-03-12 VITALS — BP 110/78 | HR 83 | Temp 98.1°F | Ht 69.0 in | Wt 195.0 lb

## 2011-03-12 DIAGNOSIS — M353 Polymyalgia rheumatica: Secondary | ICD-10-CM

## 2011-03-12 DIAGNOSIS — IMO0001 Reserved for inherently not codable concepts without codable children: Secondary | ICD-10-CM

## 2011-03-12 MED ORDER — PREDNISONE (PAK) 10 MG PO TABS: 10.0000 mg | ORAL_TABLET | Freq: Every day | ORAL | Status: DC

## 2011-03-12 NOTE — Progress Notes (Signed)
  Subjective:    Patient ID: Alexander Cohen, male    DOB: 26-Jun-1956, 55 y.o.   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 Didn't tolerate the every other day schedule Having pain in left shoulder again Stopped the creatine due to weight increasing Has been eating lots of protein  Winds up "crippled with pain" if he misses more than 1-2 days of his weight work  Now with some right posterior thigh pain when in car for a while No other leg pain now though  Current outpatient prescriptions:EPINEPHrine (EPIPEN 2-PAK) 0.3 mg/0.3 mL DEVI, Inject 1 pen subcutaneously as directed , Disp: , Rfl: ;  fluticasone (FLONASE) 50 MCG/ACT nasal spray, 2 sprays by Nasal route daily.  , Disp: , Rfl:   Past Medical History  Diagnosis Date  . Anxiety   . Hyperlipidemia   . ADHD (attention deficit hyperactivity disorder), inattentive type   . Osteoarthritis   . Nasal polyp     Past Surgical History  Procedure Date  . Inguinal hernia repair 09/1999    (Dr. Reuel Boom)  . Knee cartilage surgery 1973    Left  . Knee arthroscopy 1974    Right    Family History  Problem Relation Age of Onset  . Alcohol abuse Mother   . Alcohol abuse Father   . Diabetes Maternal Grandmother     History   Social History  . Marital Status: Married    Spouse Name: N/A    Number of Children: 3  . Years of Education: N/A   Occupational History  . PRINCIPAL     Elementary School    Social History Main Topics  . Smoking status: Never Smoker   . Smokeless tobacco: Not on file  . Alcohol Use: Not on file  . Drug Use: Not on file  . Sexually Active: Not on file   Other Topics Concern  . Not on file   Social History Narrative  . No narrative on file   Review of Systems Weight up 2# Sleeps okay Mood has been good       Objective:   Physical Exam  Psychiatric: He has a normal mood and affect. His behavior is normal. Judgment and thought content normal.          Assessment & Plan:

## 2011-03-12 NOTE — Patient Instructions (Signed)
Please start vitamin D 1000 units daily Please set up bone density test Please go back up to 10mg  of prednisone daily. If you feel good, try to slowly lower the dose as tolerated

## 2011-03-17 NOTE — Assessment & Plan Note (Signed)
[facility]                                 ON-CALL NOTE   NAME:BOSCHINIAleister, Lady                      MRN:          [id number]  DATE:09/23/2008                            DOB:          1956/03/16    PRIMARY CARE PHYSICIAN:  Dr. Alphonsus Sias.   SUBJECTIVE:  Mr. Gallo states he has been having sinus infection  symptoms for 24-48 hours.  He has congestion, scratchy throat.  He is  calling because he would like an antibiotic.   ASSESSMENT AND PLAN:  I informed the patient that we do not prescribe  antibiotics without seeing the patient, to avoid antibiotic resistance  and overuse of antibiotics.  And also does not sound like he necessarily  has a sinus infection, but instead more likely a viral infection.  He  needs to use nasal saline, Mucinex, decongestants, and followup with his  primary doctor.  If he feels he absolutely needs an antibiotic, he may  go to an Urgent Care today.     Kerby Nora, MD  Electronically Signed    AB/MedQ  DD: 09/23/2008  DT: 09/23/2008  Job #: [id number]

## 2011-03-20 NOTE — Assessment & Plan Note (Signed)
[facility]                                   ON-CALL NOTE   NAME:BOSCHINIHelmuth, Recupero                      MRN:          [id number]  DATE:08/07/2006                            DOB:          06-20-1956    PRIMARY CARE PHYSICIAN:  Karie Schwalbe, MD   Mr. Goldman called today stating that for the last week he has been having  nausea, vomiting, body aches, fatigue, and dizziness.  These symptoms have  been associated with diaphoresis and occasional neck pain.  He states that  he noticed a couple of bites on his neck but he was not sure what type of  insect may have caused this.  He also states that his glands on his neck are  swollen.  He has never had this before.   PLAN:  Advised the patient given these symptoms for the last week with no  significant improvement, I will advise him to be seen at [facility]  ED or Urgent Care for further evaluation.  The patient expressed  understanding and will go there momentarily.            ______________________________  Leanne Chang, M.D.      LA/MedQ  DD:  08/07/2006  DT:  08/09/2006  Job #:  [id number]   cc:   Karie Schwalbe, MD

## 2011-04-01 ENCOUNTER — Other Ambulatory Visit: Payer: BC Managed Care – PPO

## 2011-04-01 ENCOUNTER — Ambulatory Visit (INDEPENDENT_AMBULATORY_CARE_PROVIDER_SITE_OTHER)
Admission: RE | Admit: 2011-04-01 | Discharge: 2011-04-01 | Disposition: A | Discharge status: Home / Self Care | Payer: BC Managed Care – PPO | Source: Ambulatory Visit

## 2011-04-01 DIAGNOSIS — M353 Polymyalgia rheumatica: Secondary | ICD-10-CM

## 2011-04-01 DIAGNOSIS — Z13820 Encounter for screening for osteoporosis: Secondary | ICD-10-CM

## 2011-04-16 ENCOUNTER — Encounter: Payer: Self-pay | Admitting: Internal Medicine

## 2011-09-15 ENCOUNTER — Encounter: Payer: BC Managed Care – PPO | Admitting: Internal Medicine

## 2011-11-28 ENCOUNTER — Other Ambulatory Visit: Payer: Self-pay | Admitting: Internal Medicine

## 2011-11-30 NOTE — Telephone Encounter (Signed)
rx sent to pharmacy by e-script Spoke with patient and advised results   

## 2011-11-30 NOTE — Telephone Encounter (Signed)
Okay #30 x 1 Needs to reschedule PE within 2 months

## 2011-12-07 ENCOUNTER — Encounter: Payer: Self-pay | Admitting: Internal Medicine

## 2011-12-07 ENCOUNTER — Ambulatory Visit (INDEPENDENT_AMBULATORY_CARE_PROVIDER_SITE_OTHER): Payer: BC Managed Care – PPO | Admitting: Internal Medicine

## 2011-12-07 VITALS — BP 122/80 | HR 71 | Temp 98.0°F | Wt 200.0 lb

## 2011-12-07 DIAGNOSIS — J019 Acute sinusitis, unspecified: Secondary | ICD-10-CM

## 2011-12-07 DIAGNOSIS — M353 Polymyalgia rheumatica: Secondary | ICD-10-CM

## 2011-12-07 MED ORDER — AMOXICILLIN-POT CLAVULANATE 875-125 MG PO TABS: 1.0000 | ORAL_TABLET | Freq: Two times a day (BID) | ORAL | Status: AC

## 2011-12-07 NOTE — Assessment & Plan Note (Signed)
Seems to have bacterial infection Ran out of amoxil 2 days ago Will treat with augmentin as he does best with this Discussed symptom relief

## 2011-12-07 NOTE — Progress Notes (Signed)
  Subjective:    Patient ID: Alexander Cohen, male    DOB: [birth year], 56 y.o.   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 EPINEPHrine (EPIPEN 2-PAK) 0.3 mg/0.3 mL DEVI Inject 1 pen subcutaneously as directed       . predniSONE (DELTASONE) 10 MG tablet TAKE ONE TABLET BY MOUTH EACH DAY AS TOLERATED  30 tablet  1    No Known Allergies  Past Medical History  Diagnosis Date  . Anxiety   . Hyperlipidemia   . ADHD (attention deficit hyperactivity disorder), inattentive type   . Osteoarthritis   . Nasal polyp     Past Surgical History  Procedure Date  . Inguinal hernia repair 09/1999    (Dr. Reuel Boom)  . Knee cartilage surgery 1973    Left  . Knee arthroscopy 1974    Right    Family History  Problem Relation Age of Onset  . Alcohol abuse Mother   . Alcohol abuse Father   . Diabetes Maternal Grandmother     History   Social History  . Marital Status: Married    Spouse Name: N/A    Number of Children: 3  . Years of Education: N/A   Occupational History  . PRINCIPAL     Elementary School    Social History Main Topics  . Smoking status: Never Smoker   . Smokeless tobacco: Never Used  . Alcohol Use: Not on file  . Drug Use: Not on file  . Sexually Active: Not on file   Other Topics Concern  . Not on file   Social History Narrative  . No narrative on file   Review of Systems No vomiting or diarrhea Appetite is okay Still  on the prednisone--did try going off it cold [place]. Got insomnia, muscle pain, etc    Objective:   Physical Exam  Constitutional: He appears well-developed and well-nourished. No distress.  HENT:  Mouth/Throat: Oropharynx is clear and moist. No oropharyngeal exudate.       No sinus tenderness Moderate nasal congestion on right, marked on left with thick mucus  Neck: Normal range of motion. Neck supple.  Pulmonary/Chest: Effort normal and breath sounds normal. No respiratory distress. He has no wheezes. He has no rales.  Musculoskeletal: He exhibits no edema and no tenderness.  Lymphadenopathy:    He has no cervical adenopathy.  Psychiatric: He has a normal mood and affect. His behavior is normal. Judgment and thought content normal.          Assessment & Plan:

## 2011-12-07 NOTE — Assessment & Plan Note (Signed)
Tried going off cold [place] and did poorly i would like to try to wean him Will check sed rate Decrease to 10/5 if normal

## 2012-02-19 ENCOUNTER — Other Ambulatory Visit: Payer: Self-pay | Admitting: Internal Medicine

## 2012-04-19 ENCOUNTER — Encounter: Payer: Self-pay | Admitting: Internal Medicine

## 2012-04-19 ENCOUNTER — Ambulatory Visit (INDEPENDENT_AMBULATORY_CARE_PROVIDER_SITE_OTHER): Payer: BC Managed Care – PPO | Admitting: Internal Medicine

## 2012-04-19 VITALS — BP 112/78 | HR 82 | Temp 98.2°F | Ht 69.25 in | Wt 197.0 lb

## 2012-04-19 DIAGNOSIS — Z0000 Encounter for general adult medical examination without abnormal findings: Secondary | ICD-10-CM

## 2012-04-19 DIAGNOSIS — Z125 Encounter for screening for malignant neoplasm of prostate: Secondary | ICD-10-CM

## 2012-04-19 DIAGNOSIS — E785 Hyperlipidemia, unspecified: Secondary | ICD-10-CM

## 2012-04-19 DIAGNOSIS — M353 Polymyalgia rheumatica: Secondary | ICD-10-CM

## 2012-04-19 DIAGNOSIS — Z1211 Encounter for screening for malignant neoplasm of colon: Secondary | ICD-10-CM

## 2012-04-19 LAB — CBC WITH DIFFERENTIAL/PLATELET
Basophils Absolute: 0 10*3/uL (ref 0.0–0.1)
Eosinophils Absolute: 0.4 10*3/uL (ref 0.0–0.7)
Hemoglobin: 15.3 g/dL (ref 13.0–17.0)
Lymphocytes Relative: 23.3 % (ref 12.0–46.0)
MCHC: 33.5 g/dL (ref 30.0–36.0)
Monocytes Relative: 5.9 % (ref 3.0–12.0)
Neutrophils Relative %: 65 % (ref 43.0–77.0)
RDW: 13.4 % (ref 11.5–14.6)

## 2012-04-19 LAB — BASIC METABOLIC PANEL
CO2: 29 mEq/L (ref 19–32)
Calcium: 9 mg/dL (ref 8.4–10.5)
Creatinine, Ser: 1.1 mg/dL (ref 0.4–1.5)
GFR: 72.26 mL/min (ref >60.00)
Glucose, Bld: 87 mg/dL (ref 70–99)
Sodium: 141 mEq/L (ref 135–145)

## 2012-04-19 LAB — LIPID PANEL
Cholesterol: 245 mg/dL — ABNORMAL HIGH (ref 0–200)
HDL: 71.7 mg/dL (ref >39.00)
Total CHOL/HDL Ratio: 3
Triglycerides: 105 mg/dL (ref 0.0–149.0)

## 2012-04-19 LAB — HEPATIC FUNCTION PANEL
Alkaline Phosphatase: 43 U/L (ref 39–117)
Bilirubin, Direct: 0.1 mg/dL (ref 0.0–0.3)
Total Bilirubin: 0.6 mg/dL (ref 0.3–1.2)

## 2012-04-19 LAB — TSH: TSH: 1.52 u[IU]/mL (ref 0.35–5.50)

## 2012-04-19 MED ORDER — EPINEPHRINE 0.3 MG/0.3ML IJ DEVI: INTRAMUSCULAR | Status: DC

## 2012-04-19 MED ORDER — PREDNISONE 10 MG PO TABS: 10.0000 mg | ORAL_TABLET | ORAL | Status: DC

## 2012-04-19 NOTE — Progress Notes (Signed)
Subjective:    Patient ID: Alexander Cohen, male    DOB: [birth year], 56 y.o.   MRN: [id number]  HPI Has done well with the new school Now a little more relaxing since summertime Able to work without the day to day interruptions  Has weaned down to 10mg  every other day on the prednisone Still has some joint issues---doesn't seem like the myalgia Continues with active workout regimen---weights and aerobics ([facility] [employer])  Stress with wife's MS Has been active lately---is on Rx and is improving  Current Outpatient Prescriptions on File Prior to Visit  Medication Sig Dispense Refill  . EPINEPHrine (EPIPEN 2-PAK) 0.3 mg/0.3 mL DEVI Inject 1 pen subcutaneously as directed       . predniSONE (DELTASONE) 10 MG tablet TAKE ONE TABLET BY MOUTH EACH DAY AS TOLERATED  30 tablet  0    No Known Allergies  Past Medical History  Diagnosis Date  . Anxiety   . Hyperlipidemia   . ADHD (attention deficit hyperactivity disorder), inattentive type   . Osteoarthritis   . Nasal polyp     Past Surgical History  Procedure Date  . Inguinal hernia repair 09/1999    (Dr. Reuel Boom)  . Knee cartilage surgery 1973    Left  . Knee arthroscopy 1974    Right    Family History  Problem Relation Age of Onset  . Alcohol abuse Mother   . Alcohol abuse Father   . Diabetes Maternal Grandmother     History   Social History  . Marital Status: Married    Spouse Name: N/A    Number of Children: 3  . Years of Education: N/A   Occupational History  . PRINCIPAL     Elementary School    Social History Main Topics  . Smoking status: Never Smoker   . Smokeless tobacco: Never Used  . Alcohol Use: Not on file  . Drug Use: Not on file  . Sexually Active: Not on file   Other Topics Concern  . Not on file   Social History Narrative  . No narrative on file   Review of Systems  Constitutional: Negative for fatigue and unexpected weight change.       Wears seat belt  HENT:  Negative for hearing loss, congestion, rhinorrhea, dental problem and tinnitus.        Regular with dentist No jaw claudication Allergies quiet this spring  Eyes: Positive for photophobia. Negative for visual disturbance.       No diplopia or unilateral vision loss  Respiratory: Negative for cough, chest tightness and shortness of breath.   Cardiovascular: Negative for chest pain, palpitations and leg swelling.  Gastrointestinal: Negative for nausea, vomiting, abdominal pain, constipation and blood in stool.       No heartburn  Genitourinary: Negative for urgency, frequency and difficulty urinating.       No sexual problems  Musculoskeletal: Positive for arthralgias. Negative for back pain and joint swelling.       Mild joint pain at times--wrists and elbows  Skin: Negative for rash.       No suspicious lesions  Neurological: Negative for dizziness, syncope, weakness, light-headedness, numbness and headaches.  Hematological: Negative for adenopathy. Does not bruise/bleed easily.  Psychiatric/Behavioral: Negative for disturbed wake/sleep cycle and dysphoric mood. The patient is not nervous/anxious.        Generally sleeps okay---unless he misses the gym Anxiety still quiet--no ongoing issues       Objective:  Physical Exam  Constitutional: He is oriented to person, place, and time. He appears well-developed and well-nourished. No distress.  HENT:  Head: Normocephalic and atraumatic.  Right Ear: External ear normal.  Left Ear: External ear normal.  Mouth/Throat: Oropharynx is clear and moist. No oropharyngeal exudate.  Eyes: Conjunctivae and EOM are normal. Pupils are equal, round, and reactive to light.  Neck: Normal range of motion. Neck supple. No thyromegaly present.  Cardiovascular: Normal rate, regular rhythm, normal heart sounds and intact distal pulses.  Exam reveals no gallop.   No murmur heard. Pulmonary/Chest: Effort normal and breath sounds normal. No respiratory  distress. He has no wheezes. He has no rales.  Abdominal: Soft. There is no tenderness.  Musculoskeletal: Normal range of motion. He exhibits no edema and no tenderness.  Lymphadenopathy:    He has no cervical adenopathy.  Neurological: He is alert and oriented to person, place, and time. He exhibits normal muscle tone. Coordination normal.  Skin: No rash noted. No erythema.       Mycotic toenails have returned  Psychiatric: He has a normal mood and affect. His behavior is normal. Thought content normal.          Assessment & Plan:

## 2012-04-19 NOTE — Assessment & Plan Note (Signed)
Discussed primary prevention He is not interested and that is okay

## 2012-04-19 NOTE — Assessment & Plan Note (Signed)
Fit and healthy Will check PSA after discussion Will do stool immunoassay

## 2012-04-19 NOTE — Assessment & Plan Note (Signed)
Still seems to be in remission Will try to wean further if sed rate is normal

## 2012-04-20 ENCOUNTER — Encounter: Payer: Self-pay | Admitting: *Deleted

## 2012-04-20 LAB — LDL CHOLESTEROL, DIRECT: Direct LDL: 156.5 mg/dL

## 2012-06-09 ENCOUNTER — Ambulatory Visit (INDEPENDENT_AMBULATORY_CARE_PROVIDER_SITE_OTHER): Payer: BC Managed Care – PPO | Admitting: Family Medicine

## 2012-06-09 ENCOUNTER — Encounter: Payer: Self-pay | Admitting: Family Medicine

## 2012-06-09 VITALS — BP 108/80 | HR 77 | Temp 97.5°F | Wt 201.0 lb

## 2012-06-09 DIAGNOSIS — B029 Zoster without complications: Secondary | ICD-10-CM

## 2012-06-09 DIAGNOSIS — B360 Pityriasis versicolor: Secondary | ICD-10-CM | POA: Insufficient documentation

## 2012-06-09 MED ORDER — SELENIUM SULFIDE 2.5 % EX LOTN: TOPICAL_LOTION | CUTANEOUS | Status: DC

## 2012-06-09 MED ORDER — VALACYCLOVIR HCL 1 G PO TABS: 1000.0000 mg | ORAL_TABLET | Freq: Three times a day (TID) | ORAL | Status: AC

## 2012-06-09 NOTE — Patient Instructions (Addendum)
Start the valtrex today.  If you have any vision changes or eye pain, then call the eye doctor immediately.   Use the selenium on your chest.

## 2012-06-09 NOTE — Assessment & Plan Note (Signed)
D/w pt ZO:XWRU/EAVW.  Out of work now.  No pain, start valtrex.  Return to work when crusted fully.  He agrees. If any eye sx, then proceed to eye clinic. He agrees.

## 2012-06-09 NOTE — Progress Notes (Signed)
2 lesions on L side of check with change in sensation on L side of head, superior to the brow, stops at the midline.  He can still feel on the skin, but the sensation is altered.  No sensation changes in V2 or V3 distribution.  No FCNAVD. Skin on L side of scalp was stinging in the shower this AM.  No R sided sx.  No vision changes.  No eye pain.    Also with rash on midline of chest for about 1 month.  Doesn't itch or hurt.    Meds, vitals, and allergies reviewed.   ROS: See HPI.  Otherwise, noncontributory.  nad PERRL, EOMI ncat but 2 lesions on L side of face with erythema.   Normal dye exam in the L eye w/o corneal defect seen.  Op wnl Neck supple w/o LA Altered sensation on L side of scalp, anteriorly, persists to but not beyond the midline Chest with rash noted, irregular border.  Looks typical for Tinea versicolor.

## 2012-06-09 NOTE — Assessment & Plan Note (Signed)
Use topical selenium and f/u prn.

## 2012-11-05 ENCOUNTER — Encounter: Payer: Self-pay | Admitting: Family Medicine

## 2012-11-05 ENCOUNTER — Ambulatory Visit (INDEPENDENT_AMBULATORY_CARE_PROVIDER_SITE_OTHER): Payer: BC Managed Care – PPO | Admitting: Family Medicine

## 2012-11-05 VITALS — BP 118/80 | HR 93 | Ht 69.0 in | Wt 198.0 lb

## 2012-11-05 DIAGNOSIS — J329 Chronic sinusitis, unspecified: Secondary | ICD-10-CM

## 2012-11-05 MED ORDER — FLUTICASONE PROPIONATE 50 MCG/ACT NA SUSP: 2.0000 | Freq: Every day | NASAL | Status: DC

## 2012-11-05 MED ORDER — AMOXICILLIN-POT CLAVULANATE 875-125 MG PO TABS: 1.0000 | ORAL_TABLET | Freq: Two times a day (BID) | ORAL | Status: DC

## 2012-11-05 NOTE — Progress Notes (Signed)
  Subjective:     Alexander Cohen is a 57 y.o. male who presents for evaluation of sinus pain. Symptoms include: congestion, facial pain, headaches, nasal congestion, purulent rhinorrhea, sinus pressure and nose sore that is recurrent per pt. Onset of symptoms was 3 days ago. Symptoms have been gradually worsening since that time. Past history is significant for no history of pneumonia or bronchitis. Patient is a non-smoker.  The following portions of the patient's history were reviewed and updated as appropriate: allergies, current medications, past family history, past medical history, past social history, past surgical history and problem list.  Review of Systems Pertinent items are noted in HPI.   Objective:    BP 118/80  Pulse 93  Ht 5\' 9"  (1.753 m)  Wt 198 lb (89.812 kg)  BMI 29.24 kg/m2  SpO2 96% General appearance: alert Ears: normal TM's and external ear canals both ears Nose: green discharge, mild congestion, turbinates red, swollen, +crusted area , tender, sinus tenderness bilateral, - Throat: lips, mucosa, and tongue normal; teeth and gums normal Neck: mild anterior cervical adenopathy, supple, symmetrical, trachea midline and thyroid not enlarged, symmetric, no tenderness/mass/nodules Lungs: clear to auscultation bilaterally Heart: S1, S2 normal    Assessment:    Acute bacterial sinusitis.    Plan:    Nasal steroids per medication orders. Antihistamines per medication orders. Augmentin per medication orders.  F;u prn

## 2012-11-05 NOTE — Patient Instructions (Signed)

## 2013-05-02 ENCOUNTER — Other Ambulatory Visit: Payer: Self-pay | Admitting: Internal Medicine

## 2013-05-03 NOTE — Telephone Encounter (Signed)
Okay #45 x 0 He is a little overdue for his physical He should schedule as soon as practical (like sometime this summer)

## 2013-05-03 NOTE — Telephone Encounter (Signed)
rx sent to pharmacy by e-script Left message on machine pt needs to schedule appt

## 2013-08-07 ENCOUNTER — Other Ambulatory Visit: Payer: Self-pay | Admitting: Internal Medicine

## 2013-08-07 NOTE — Telephone Encounter (Signed)
Ok to fill? No future appts scheduled and last seen 04/2012

## 2013-08-08 NOTE — Telephone Encounter (Signed)
rx sent to pharmacy by e-script Left message on VM that pt needs to schedule follow-up

## 2013-08-08 NOTE — Telephone Encounter (Signed)
Okay #45 x 1 Have him schedule PE within the next few months

## 2013-09-13 ENCOUNTER — Other Ambulatory Visit: Payer: Self-pay | Admitting: Family Medicine

## 2013-09-13 ENCOUNTER — Other Ambulatory Visit: Payer: BC Managed Care – PPO

## 2013-09-15 ENCOUNTER — Ambulatory Visit (INDEPENDENT_AMBULATORY_CARE_PROVIDER_SITE_OTHER): Payer: BC Managed Care – PPO | Admitting: Internal Medicine

## 2013-09-15 ENCOUNTER — Encounter: Payer: Self-pay | Admitting: Internal Medicine

## 2013-09-15 VITALS — BP 110/60 | HR 74 | Temp 97.6°F | Ht 69.0 in | Wt 201.0 lb

## 2013-09-15 DIAGNOSIS — M353 Polymyalgia rheumatica: Secondary | ICD-10-CM

## 2013-09-15 DIAGNOSIS — Z0000 Encounter for general adult medical examination without abnormal findings: Secondary | ICD-10-CM

## 2013-09-15 DIAGNOSIS — E785 Hyperlipidemia, unspecified: Secondary | ICD-10-CM

## 2013-09-15 LAB — BASIC METABOLIC PANEL
CO2: 31 mEq/L (ref 19–32)
Chloride: 106 mEq/L (ref 96–112)
Creatinine, Ser: 1 mg/dL (ref 0.4–1.5)
Potassium: 4.5 mEq/L (ref 3.5–5.1)
Sodium: 139 mEq/L (ref 135–145)

## 2013-09-15 LAB — CBC WITH DIFFERENTIAL/PLATELET
Basophils Absolute: 0 10*3/uL (ref 0.0–0.1)
Basophils Relative: 0.6 % (ref 0.0–3.0)
Eosinophils Absolute: 0.7 10*3/uL (ref 0.0–0.7)
Eosinophils Relative: 11.2 % — ABNORMAL HIGH (ref 0.0–5.0)
HCT: 46.8 % (ref 39.0–52.0)
Hemoglobin: 15.8 g/dL (ref 13.0–17.0)
Lymphocytes Relative: 29.1 % (ref 12.0–46.0)
Lymphs Abs: 1.8 10*3/uL (ref 0.7–4.0)
MCHC: 33.7 g/dL (ref 30.0–36.0)
MCV: 91.8 fl (ref 78.0–100.0)
Monocytes Relative: 7.9 % (ref 3.0–12.0)
Neutro Abs: 3.2 10*3/uL (ref 1.4–7.7)
Neutrophils Relative %: 51.2 % (ref 43.0–77.0)
RBC: 5.1 Mil/uL (ref 4.22–5.81)
WBC: 6.2 10*3/uL (ref 4.5–10.5)

## 2013-09-15 LAB — SEDIMENTATION RATE: Sed Rate: 5 mm/hr (ref 0–22)

## 2013-09-15 LAB — HEPATIC FUNCTION PANEL
ALT: 30 U/L (ref 0–53)
Alkaline Phosphatase: 39 U/L (ref 39–117)
Bilirubin, Direct: 0.1 mg/dL (ref 0.0–0.3)
Total Bilirubin: 1 mg/dL (ref 0.3–1.2)

## 2013-09-15 LAB — LIPID PANEL
Total CHOL/HDL Ratio: 4
Triglycerides: 88 mg/dL (ref 0.0–149.0)

## 2013-09-15 MED ORDER — KETOCONAZOLE 2 % EX CREA: 1.0000 "application " | TOPICAL_CREAM | Freq: Every day | CUTANEOUS | Status: DC

## 2013-09-15 MED ORDER — PREDNISONE 5 MG PO TABS: 10.0000 mg | ORAL_TABLET | ORAL | Status: DC

## 2013-09-15 NOTE — Assessment & Plan Note (Signed)
Discussed primary prevention--he doesn't want meds Will recheck though

## 2013-09-15 NOTE — Progress Notes (Signed)
Subjective:    Patient ID: Alexander Cohen, male    DOB: 1956/04/04, 57 y.o.   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 EPINEPHrine (EPIPEN 2-PAK) 0.3 mg/0.3 mL DEVI Inject 1 pen subcutaneously as directed  1 Device  1  . fluticasone (FLONASE) 50 MCG/ACT nasal spray Place 2 sprays into the nose daily.  16 g  6   No current facility-administered medications on file prior to visit.    No Known Allergies  Past Medical History  Diagnosis Date  . Anxiety   . ADHD (attention deficit hyperactivity disorder), inattentive type   . Osteoarthritis   . Nasal polyp   . Hyperlipidemia     Past Surgical History  Procedure Laterality Date  . Inguinal hernia repair  09/1999    (Dr. Reuel Boom)  . Knee cartilage surgery  1973    Left  . Knee arthroscopy  1974    Right    Family History  Problem Relation Age of Onset  . Alcohol abuse Mother   . Alcohol abuse Father   . Diabetes Maternal Grandmother     History   Social History  . Marital Status: Married    Spouse Name: N/A    Number of Children: 3  . Years of Education: N/A   Occupational History  . PRINCIPAL     Elementary School    Social History Main Topics  . Smoking status: Never Smoker   . Smokeless tobacco: Never Used  . Alcohol Use: Not on file  . Drug Use: Not on file  . Sexual Activity: Not on file   Other Topics Concern  . Not on file   Social History Narrative  . No narrative on file   Review of Systems  Constitutional: Negative for fatigue and unexpected weight change.       Wears seat belt   HENT: Positive for congestion and rhinorrhea. Negative for dental problem and tinnitus.        Regular with dentist  Eyes: Negative for visual disturbance.       No diplopia or unilateral vision loss  Respiratory: Negative for cough, chest tightness and shortness of breath.   Cardiovascular: Negative for chest pain, palpitations and leg swelling.  Gastrointestinal: Negative for nausea, vomiting, abdominal pain, constipation and blood in stool.       No heartburn  Endocrine: Negative for cold intolerance and heat intolerance.  Genitourinary: Negative for urgency, frequency and difficulty urinating.       No sexual problems  Musculoskeletal: Positive for arthralgias and myalgias. Negative for back pain and joint swelling.  Skin: Positive for rash.       Recurrent ringworm from cats  Allergic/Immunologic: Positive for environmental allergies. Negative for immunocompromised state.  Neurological: Negative for dizziness, syncope, weakness, light-headedness, numbness and headaches.  Psychiatric/Behavioral: Positive for sleep disturbance. Negative for dysphoric mood. The patient is not nervous/anxious.        Sleep affected by some sleep       Objective:   Physical Exam  Constitutional: He is oriented to person, place, and time. He  appears well-developed and well-nourished. No distress.  HENT:  Head: Normocephalic and atraumatic.  Right Ear: External ear normal.  Left Ear: External ear normal.  Mouth/Throat: Oropharynx is clear and moist. No oropharyngeal exudate.  Eyes: Conjunctivae and EOM are normal. Pupils are equal, round, and reactive to light.  Neck: Normal range of motion. Neck supple. No thyromegaly present.  Cardiovascular: Normal rate, regular rhythm, normal heart sounds and intact distal pulses.  Exam reveals no gallop.   No murmur heard. Pulmonary/Chest: Effort normal and breath sounds normal. No respiratory distress. He has no wheezes. He has no rales.  Abdominal: Soft.  There is no tenderness.  Musculoskeletal: He exhibits no edema and no tenderness.  Lymphadenopathy:    He has no cervical adenopathy.  Neurological: He is alert and oriented to person, place, and time.  Skin: No erythema.  Slight circular lesion by right neck  Psychiatric: He has a normal mood and affect. His behavior is normal.          Assessment & Plan:

## 2013-09-15 NOTE — Progress Notes (Signed)
Pre-visit discussion using our clinic review tool. No additional management support is needed unless otherwise documented below in the visit note.  

## 2013-09-15 NOTE — Assessment & Plan Note (Signed)
Healthy Will defer PSA this year He just did fecal test

## 2013-09-15 NOTE — Patient Instructions (Signed)
Please try decreasing your prednisone by 2.5 mg every other day-- every 2-3 weeks. For example, go down to 7.5mg  then 10mg  2 days later, then 7.5mg . Then in 2-3 weeks, go down to 7.5mg  every other day.

## 2013-09-15 NOTE — Assessment & Plan Note (Signed)
Seems to be quiet but hard to tell Discussed trying to wean down

## 2013-09-18 ENCOUNTER — Encounter: Payer: Self-pay | Admitting: *Deleted

## 2013-11-22 ENCOUNTER — Encounter: Payer: Self-pay | Admitting: Internal Medicine

## 2013-11-22 ENCOUNTER — Ambulatory Visit (INDEPENDENT_AMBULATORY_CARE_PROVIDER_SITE_OTHER): Payer: BC Managed Care – PPO | Admitting: Internal Medicine

## 2013-11-22 VITALS — BP 126/82 | HR 78 | Temp 98.0°F | Wt 200.0 lb

## 2013-11-22 DIAGNOSIS — J019 Acute sinusitis, unspecified: Secondary | ICD-10-CM

## 2013-11-22 DIAGNOSIS — L309 Dermatitis, unspecified: Secondary | ICD-10-CM

## 2013-11-22 DIAGNOSIS — L259 Unspecified contact dermatitis, unspecified cause: Secondary | ICD-10-CM

## 2013-11-22 MED ORDER — TRIAMCINOLONE ACETONIDE 0.1 % EX CREA: 1.0000 "application " | TOPICAL_CREAM | Freq: Two times a day (BID) | CUTANEOUS | Status: DC

## 2013-11-22 MED ORDER — AMOXICILLIN-POT CLAVULANATE 875-125 MG PO TABS: 1.0000 | ORAL_TABLET | Freq: Two times a day (BID) | ORAL | Status: DC

## 2013-11-22 NOTE — Progress Notes (Signed)
HPI  Pt presents to the clinic today with c/o facial pressure. This started about 1 month ago. He reports that there is not much mucous coming out. The mucous has turned thick yellow. He has tried Copywriter, advertising cold and Afrin OTC. He denies fever, chills or body aches. He has no history of allergies or breathing problems. He does have a history of recurrent sinus infections.  Review of Systems    Past Medical History  Diagnosis Date  . Anxiety   . ADHD (attention deficit hyperactivity disorder), inattentive type   . Osteoarthritis   . Nasal polyp   . Hyperlipidemia     Family History  Problem Relation Age of Onset  . Alcohol abuse Mother   . Alcohol abuse Father   . Diabetes Maternal Grandmother     History   Social History  . Marital Status: Married    Spouse Name: N/A    Number of Children: 3  . Years of Education: N/A   Occupational History  . PRINCIPAL     Elementary School    Social History Main Topics  . Smoking status: Never Smoker   . Smokeless tobacco: Never Used  . Alcohol Use: No     Comment: moderate  . Drug Use: No  . Sexual Activity: Not on file   Other Topics Concern  . Not on file   Social History Narrative  . No narrative on file    No Known Allergies   Constitutional: Positive headache, fatigue. Denies fever or abrupt weight changes.  HEENT:  Positive eye pain, pressure behind the eyes, facial pain, nasal congestion. Denies eye redness, ear pain, ringing in the ears, wax buildup, runny nose or bloody nose. Respiratory:. Denies cough, difficulty breathing or shortness of breath.  Cardiovascular: Denies chest pain, chest tightness, palpitations or swelling in the hands or feet.   No other specific complaints in a complete review of systems (except as listed in HPI above).  Objective:    BP 126/82  Pulse 78  Temp(Src) 98 F (36.7 C) (Oral)  Wt 200 lb (90.719 kg)  SpO2 97% Wt Readings from Last 3 Encounters:  11/22/13 200 lb (90.719  kg)  09/15/13 201 lb (91.173 kg)  11/05/12 198 lb (89.812 kg)    General: Appears his stated age, well developed, well nourished in NAD. HEENT: Head: normal shape and size, maxillary sinus tenderness; Eyes: sclera white, no icterus, conjunctiva pink, PERRLA and EOMs intact; Ears: Tm's gray and intact, normal light reflex; Nose: mucosa pink and moist, septum midline; Throat/Mouth: + PND. Teeth present, mucosa pink and moist, no exudate noted, no lesions or ulcerations noted.  Neck:  Neck supple, trachea midline. No massses, lumps or thyromegaly present.  Cardiovascular: Normal rate and rhythm. S1,S2 noted.  No murmur, rubs or gallops noted. No JVD or BLE edema. No carotid bruits noted. Pulmonary/Chest: Normal effort and positive vesicular breath sounds. No respiratory distress. No wheezes, rales or ronchi noted.      Assessment & Plan:   Acute bacterial sinusitis  Can use a Neti Pot which can be purchased from your local drug store. Flonase 2 sprays each nostril for 3 days and then as needed. Augmentin BID for 10 days  RTC as needed or if symptoms persist.

## 2013-11-22 NOTE — Progress Notes (Signed)
Pre-visit discussion using our clinic review tool. No additional management support is needed unless otherwise documented below in the visit note.  

## 2013-11-22 NOTE — Patient Instructions (Signed)

## 2013-12-02 ENCOUNTER — Other Ambulatory Visit: Payer: Self-pay | Admitting: Family Medicine

## 2014-01-26 ENCOUNTER — Ambulatory Visit (INDEPENDENT_AMBULATORY_CARE_PROVIDER_SITE_OTHER): Payer: BC Managed Care – PPO | Admitting: Internal Medicine

## 2014-01-26 ENCOUNTER — Encounter: Payer: Self-pay | Admitting: Internal Medicine

## 2014-01-26 VITALS — BP 110/80 | HR 81 | Temp 98.6°F | Wt 201.0 lb

## 2014-01-26 DIAGNOSIS — M353 Polymyalgia rheumatica: Secondary | ICD-10-CM

## 2014-01-26 DIAGNOSIS — B354 Tinea corporis: Secondary | ICD-10-CM

## 2014-01-26 MED ORDER — FLUCONAZOLE 100 MG PO TABS: 100.0000 mg | ORAL_TABLET | Freq: Every day | ORAL | Status: DC

## 2014-01-26 NOTE — Assessment & Plan Note (Signed)
Unable to wean below 5 every other day

## 2014-01-26 NOTE — Progress Notes (Signed)
   Subjective:    Patient ID: Alexander Cohen, male    DOB: Nov 15, 1955, 58 y.o.   MRN: [id number]  HPI Had rash on left wrist---has been expanding Also some redness on his cheeks Triamcinolone didn't help Tried ketoconazole cream also--didn't work this time (has gotten infections from the cat that this did help)  Had flaky crusting on right ear and preauricular area  Left wrist is itchy--some on the ear also  Current Outpatient Prescriptions on File Prior to Visit  Medication Sig Dispense Refill  . EPINEPHrine (EPIPEN 2-PAK) 0.3 mg/0.3 mL DEVI Inject 1 pen subcutaneously as directed  1 Device  1  . fluticasone (FLONASE) 50 MCG/ACT nasal spray PLACE 2 SPRAYS INTO THE NOSE DAILY.  16 g  0  . ketoconazole (NIZORAL) 2 % cream Apply 1 application topically daily.  60 g  1  . triamcinolone cream (KENALOG) 0.1 % Apply 1 application topically 2 (two) times daily.  30 g  0   No current facility-administered medications on file prior to visit.    No Known Allergies  Past Medical History  Diagnosis Date  . Anxiety   . ADHD (attention deficit hyperactivity disorder), inattentive type   . Osteoarthritis   . Nasal polyp   . Hyperlipidemia     Past Surgical History  Procedure Laterality Date  . Inguinal hernia repair  09/1999    (Dr. Quillian Quince)  . Knee cartilage surgery  1973    Left  . Knee arthroscopy  1974    Right    Family History  Problem Relation Age of Onset  . Alcohol abuse Mother   . Alcohol abuse Father   . Diabetes Maternal Grandmother     History   Social History  . Marital Status: Married    Spouse Name: N/A    Number of Children: 3  . Years of Education: N/A   Occupational History  . PRINCIPAL     Elementary School    Social History Main Topics  . Smoking status: Never Smoker   . Smokeless tobacco: Never Used  . Alcohol Use: No     Comment: moderate  . Drug Use: No  . Sexual Activity: Not on file   Other Topics Concern  . Not on file   Social  History Narrative  . No narrative on file   Review of Systems No fever Doesn't feel sick Still some swelling on top of head--lid of recycling can hit it 2 weeks ago Trigger finger left 3rd finger    Objective:   Physical Exam  HENT:  Indurated spot on vertex of head--not inflamed  Skin:  Circular slightly raised rash on left wrist and slightly in right preauricular area          Assessment & Plan:

## 2014-01-26 NOTE — Progress Notes (Signed)
Pre visit review using our clinic review tool, if applicable. No additional management support is needed unless otherwise documented below in the visit note. 

## 2014-01-26 NOTE — Patient Instructions (Signed)
If your rash persists, set up an appointment with a dermatologist.  If the trigger finger bothers you more, you can call for an appointment with Dr Copland---he can inject cortisone in it.

## 2014-01-26 NOTE — Assessment & Plan Note (Signed)
Will try fluconazole  Derm if doesn't improve

## 2014-04-11 ENCOUNTER — Telehealth: Payer: Self-pay | Admitting: Internal Medicine

## 2014-04-11 ENCOUNTER — Ambulatory Visit: Payer: BC Managed Care – PPO | Admitting: Internal Medicine

## 2014-04-11 MED ORDER — FLUCONAZOLE 100 MG PO TABS: 100.0000 mg | ORAL_TABLET | Freq: Every day | ORAL | Status: DC

## 2014-04-11 NOTE — Telephone Encounter (Signed)
Patient said he woke up this morning and had a 50 cent size ringworm above his right eye.  Patient said it has been a couple months since he had it the last time.  Patient said the pills that were called in the last time worked.  He said the cream works with minor flare ups, but the pills work better when it's worse.  Patient uses [facility].  Please call patient when prescription is called in.

## 2014-04-11 NOTE — Telephone Encounter (Signed)
Left message rx called in

## 2014-04-11 NOTE — Telephone Encounter (Signed)
Okay to send fluconazole 150mg  #2 x 1  Take one now for ringworm and repeat in 1 week

## 2014-04-11 NOTE — Addendum Note (Signed)
Addended by: Despina Hidden on: 04/11/2014 09:39 AM   Modules accepted: Orders

## 2014-04-11 NOTE — Telephone Encounter (Signed)
Spoke with Dr. Silvio Pate and corrected rx, pt wanted what he got before, rx sent to pharmacy

## 2014-08-17 ENCOUNTER — Other Ambulatory Visit: Payer: Self-pay

## 2014-09-10 ENCOUNTER — Ambulatory Visit (INDEPENDENT_AMBULATORY_CARE_PROVIDER_SITE_OTHER): Payer: BC Managed Care – PPO | Admitting: Internal Medicine

## 2014-09-10 ENCOUNTER — Encounter: Payer: Self-pay | Admitting: Internal Medicine

## 2014-09-10 VITALS — BP 128/80 | HR 90 | Temp 97.9°F | Wt 195.0 lb

## 2014-09-10 DIAGNOSIS — M064 Inflammatory polyarthropathy: Secondary | ICD-10-CM

## 2014-09-10 DIAGNOSIS — M130 Polyarthritis, unspecified: Secondary | ICD-10-CM

## 2014-09-10 DIAGNOSIS — Z23 Encounter for immunization: Secondary | ICD-10-CM

## 2014-09-10 DIAGNOSIS — M159 Polyosteoarthritis, unspecified: Secondary | ICD-10-CM | POA: Insufficient documentation

## 2014-09-10 LAB — CBC WITH DIFFERENTIAL/PLATELET
BASOS ABS: 0 10*3/uL (ref 0.0–0.1)
Basophils Relative: 0.4 % (ref 0.0–3.0)
EOS ABS: 0.5 10*3/uL (ref 0.0–0.7)
Eosinophils Relative: 4.8 % (ref 0.0–5.0)
HCT: 45.6 % (ref 39.0–52.0)
HEMOGLOBIN: 15.1 g/dL (ref 13.0–17.0)
LYMPHS PCT: 19 % (ref 12.0–46.0)
Lymphs Abs: 1.8 10*3/uL (ref 0.7–4.0)
MCHC: 33.2 g/dL (ref 30.0–36.0)
MCV: 91.1 fl (ref 78.0–100.0)
MONOS PCT: 6.7 % (ref 3.0–12.0)
Monocytes Absolute: 0.6 10*3/uL (ref 0.1–1.0)
NEUTROS ABS: 6.7 10*3/uL (ref 1.4–7.7)
NEUTROS PCT: 69.1 % (ref 43.0–77.0)
PLATELETS: 250 10*3/uL (ref 150.0–400.0)
RBC: 5 Mil/uL (ref 4.22–5.81)
RDW: 12.5 % (ref 11.5–15.5)
WBC: 9.7 10*3/uL (ref 4.0–10.5)

## 2014-09-10 LAB — COMPREHENSIVE METABOLIC PANEL
ALT: 35 U/L (ref 0–53)
AST: 29 U/L (ref 0–37)
Albumin: 3.2 g/dL — ABNORMAL LOW (ref 3.5–5.2)
Alkaline Phosphatase: 50 U/L (ref 39–117)
BUN: 17 mg/dL (ref 6–23)
CALCIUM: 9.4 mg/dL (ref 8.4–10.5)
CO2: 28 meq/L (ref 19–32)
CREATININE: 1 mg/dL (ref 0.4–1.5)
Chloride: 104 mEq/L (ref 96–112)
GFR: 83.73 mL/min (ref >60.00)
Glucose, Bld: 89 mg/dL (ref 70–99)
POTASSIUM: 4.2 meq/L (ref 3.5–5.1)
Sodium: 140 mEq/L (ref 135–145)
TOTAL PROTEIN: 6.9 g/dL (ref 6.0–8.3)
Total Bilirubin: 0.4 mg/dL (ref 0.2–1.2)

## 2014-09-10 LAB — RHEUMATOID FACTOR: Rhuematoid fact SerPl-aCnc: <10 IU/mL (ref <=14)

## 2014-09-10 LAB — SEDIMENTATION RATE: Sed Rate: 24 mm/hr — ABNORMAL HIGH (ref 0–22)

## 2014-09-10 LAB — T4, FREE: FREE T4: 0.88 ng/dL (ref 0.60–1.60)

## 2014-09-10 MED ORDER — HYDROCODONE-ACETAMINOPHEN 5-325 MG PO TABS: 1.0000 | ORAL_TABLET | Freq: Four times a day (QID) | ORAL | Status: DC | PRN

## 2014-09-10 NOTE — Assessment & Plan Note (Signed)
Very suspicious for RA-- symmetric joint swelling with AM stiffness, etc May have been this when I thought PMR --but he really seemed more muscle before Discussed options If blood work confirms diagnosis, will briefly increase prednisone and start MTX. If no diagnosis, will refer to rheumatology to try to get a definitve diagnosis

## 2014-09-10 NOTE — Progress Notes (Signed)
Pre visit review using our clinic review tool, if applicable. No additional management support is needed unless otherwise documented below in the visit note. 

## 2014-09-10 NOTE — Progress Notes (Signed)
   Subjective:    Patient ID: Alexander Cohen, male    DOB: 04-29-1956, 58 y.o.   MRN: [id number]  HPI Having terrible pain Knees, shoulders, hips Severe---can't sleep Can't reach for things in cupboard Hands locked in place in AM  Hot pokers in backs of legs at times Hands swell---can't wear wedding rings  Ibuprofen, heat, OTC rubs not really helping Has had to cut back at gym  No RA or SLE that he knows of in family  Current Outpatient Prescriptions on File Prior to Visit  Medication Sig Dispense Refill  . EPINEPHrine (EPIPEN 2-PAK) 0.3 mg/0.3 mL DEVI Inject 1 pen subcutaneously as directed 1 Device 1  . fluticasone (FLONASE) 50 MCG/ACT nasal spray PLACE 2 SPRAYS INTO THE NOSE DAILY. 16 g 0   No current facility-administered medications on file prior to visit.    No Known Allergies  Past Medical History  Diagnosis Date  . Anxiety   . ADHD (attention deficit hyperactivity disorder), inattentive type   . Osteoarthritis   . Nasal polyp   . Hyperlipidemia     Past Surgical History  Procedure Laterality Date  . Inguinal hernia repair  09/1999    (Dr. Quillian Quince)  . Knee cartilage surgery  1973    Left  . Knee arthroscopy  1974    Right    Family History  Problem Relation Age of Onset  . Alcohol abuse Mother   . Alcohol abuse Father   . Diabetes Maternal Grandmother     History   Social History  . Marital Status: Married    Spouse Name: N/A    Number of Children: 3  . Years of Education: N/A   Occupational History  . PRINCIPAL     Elementary School    Social History Main Topics  . Smoking status: Never Smoker   . Smokeless tobacco: Never Used  . Alcohol Use: No     Comment: moderate  . Drug Use: No  . Sexual Activity: Not on file   Other Topics Concern  . Not on file   Social History Narrative   Review of Systems  Prednisone every other day No fevers No rash No swallowing problems Ongoing sinus problems     Objective:   Physical Exam    Constitutional: He appears well-developed and well-nourished. No distress.  Neck: Normal range of motion. Neck supple. No thyromegaly present.  Cardiovascular: Normal rate, regular rhythm and normal heart sounds.  Exam reveals no gallop.   No murmur heard. Pulmonary/Chest: Effort normal and breath sounds normal. No respiratory distress. He has no wheezes. He has no rales.  Abdominal: Soft. There is no tenderness. There is no rebound.  Musculoskeletal:  Mild swelling in PIPs in both hands Mild pain with internal rotation of hips No clear synovitis  Lymphadenopathy:    He has no cervical adenopathy.  Psychiatric: He has a normal mood and affect. His behavior is normal.          Assessment & Plan:

## 2014-09-10 NOTE — Addendum Note (Signed)
Addended by: Despina Hidden on: 09/10/2014 05:08 PM   Modules accepted: Orders

## 2014-09-11 LAB — ANA: Anti Nuclear Antibody(ANA): POSITIVE — AB

## 2014-09-11 LAB — ANTI-NUCLEAR AB-TITER (ANA TITER): ANA TITER 1: NEGATIVE (ref <1:40)

## 2014-09-12 ENCOUNTER — Other Ambulatory Visit: Payer: Self-pay | Admitting: *Deleted

## 2014-09-12 MED ORDER — PREDNISONE 20 MG PO TABS: 20.0000 mg | ORAL_TABLET | Freq: Every day | ORAL | Status: DC

## 2014-10-09 ENCOUNTER — Encounter: Payer: Self-pay | Admitting: Internal Medicine

## 2014-10-09 ENCOUNTER — Ambulatory Visit (INDEPENDENT_AMBULATORY_CARE_PROVIDER_SITE_OTHER): Payer: BC Managed Care – PPO | Admitting: Internal Medicine

## 2014-10-09 VITALS — BP 120/80 | HR 79 | Temp 98.5°F | Ht 69.0 in | Wt 192.0 lb

## 2014-10-09 DIAGNOSIS — M353 Polymyalgia rheumatica: Secondary | ICD-10-CM

## 2014-10-09 DIAGNOSIS — E785 Hyperlipidemia, unspecified: Secondary | ICD-10-CM

## 2014-10-09 DIAGNOSIS — Z1211 Encounter for screening for malignant neoplasm of colon: Secondary | ICD-10-CM

## 2014-10-09 DIAGNOSIS — Z0000 Encounter for general adult medical examination without abnormal findings: Secondary | ICD-10-CM

## 2014-10-09 NOTE — Progress Notes (Signed)
Subjective:    Patient ID: Alexander Cohen, male    DOB: 1956-04-19, 58 y.o.   MRN: [id number]  HPI Here for physical and follow up of PMR  He is doing much better Now back down to 15mg  daily of the prednisone Is back at gym---regular routine Hands feel normal---stiffness and swelling has resolved Discussed adding vitamin D Had bone density screen recently that was very good  No other new concerns  Current Outpatient Prescriptions on File Prior to Visit  Medication Sig Dispense Refill  . EPINEPHrine (EPIPEN 2-PAK) 0.3 mg/0.3 mL DEVI Inject 1 pen subcutaneously as directed 1 Device 1  . fluticasone (FLONASE) 50 MCG/ACT nasal spray PLACE 2 SPRAYS INTO THE NOSE DAILY. 16 g 0  . HYDROcodone-acetaminophen (NORCO/VICODIN) 5-325 MG per tablet Take 1 tablet by mouth every 6 (six) hours as needed for moderate pain. 30 tablet 0   No current facility-administered medications on file prior to visit.    No Known Allergies  Past Medical History  Diagnosis Date  . Anxiety   . ADHD (attention deficit hyperactivity disorder), inattentive type   . Osteoarthritis   . Nasal polyp   . Hyperlipidemia     Past Surgical History  Procedure Laterality Date  . Inguinal hernia repair  09/1999    (Dr. Quillian Quince)  . Knee cartilage surgery  1973    Left  . Knee arthroscopy  1974    Right    Family History  Problem Relation Age of Onset  . Alcohol abuse Mother   . Alcohol abuse Father   . Diabetes Maternal Grandmother     History   Social History  . Marital Status: Married    Spouse Name: N/A    Number of Children: 3  . Years of Education: N/A   Occupational History  . PRINCIPAL     Elementary School    Social History Main Topics  . Smoking status: Never Smoker   . Smokeless tobacco: Never Used  . Alcohol Use: No     Comment: moderate  . Drug Use: No  . Sexual Activity: Not on file   Other Topics Concern  . Not on file   Social History Narrative   Review of Systems    Constitutional: Negative for fatigue and unexpected weight change.       Wears seat belt  HENT: Positive for hearing loss. Negative for tinnitus.        Mild hearing deficit Regular with dentist  Eyes: Negative for visual disturbance.       No diplopia or unilateral vision loss  Respiratory: Negative for cough, chest tightness and shortness of breath.   Cardiovascular: Negative for chest pain, palpitations and leg swelling.  Gastrointestinal: Negative for nausea, vomiting, abdominal pain, constipation and blood in stool.       No heartburn  Endocrine: Negative for polydipsia and polyuria.  Genitourinary: Negative for urgency, frequency and difficulty urinating.       Nocturia x 2 No sexual problems  Musculoskeletal: Positive for arthralgias. Negative for back pain and joint swelling.  Skin: Negative for rash.       No suspicious lesions  Allergic/Immunologic: Positive for environmental allergies. Negative for immunocompromised state.       Allergies less of a problem  Neurological: Negative for dizziness, syncope, weakness, light-headedness, numbness and headaches.  Hematological: Negative for adenopathy. Does not bruise/bleed easily.  Psychiatric/Behavioral: Positive for dysphoric mood. The patient is nervous/anxious.        Variable sleep---related  to stress. Only work stress       Objective:   Physical Exam  Constitutional: He is oriented to person, place, and time. He appears well-developed and well-nourished. No distress.  HENT:  Head: Normocephalic and atraumatic.  Right Ear: External ear normal.  Left Ear: External ear normal.  Mouth/Throat: Oropharynx is clear and moist. No oropharyngeal exudate.  Eyes: Conjunctivae and EOM are normal. Pupils are equal, round, and reactive to light.  Neck: Normal range of motion. Neck supple. No thyromegaly present.  Cardiovascular: Normal rate, regular rhythm, normal heart sounds and intact distal pulses.  Exam reveals no gallop.    No murmur heard. Pulmonary/Chest: Effort normal and breath sounds normal. No respiratory distress. He has no wheezes. He has no rales.  Abdominal: Soft. There is no tenderness.  Musculoskeletal: He exhibits no edema or tenderness.  No joint swelling anywhere  Lymphadenopathy:    He has no cervical adenopathy.  Neurological: He is alert and oriented to person, place, and time.  Skin: No rash noted. No erythema.  Psychiatric: He has a normal mood and affect. His behavior is normal.          Assessment & Plan:

## 2014-10-09 NOTE — Patient Instructions (Signed)
Please wean down the prednisone---making a change about every month. 10/15 at first--- then 10/10, then 10/7.5, then 10/5, then 10/2.5---then 10mg  every other day

## 2014-10-09 NOTE — Assessment & Plan Note (Signed)
Will check fecal immunoassay Discussed PSA--will defer to next year

## 2014-10-09 NOTE — Progress Notes (Signed)
Pre visit review using our clinic review tool, if applicable. No additional management support is needed unless otherwise documented below in the visit note. 

## 2014-10-09 NOTE — Assessment & Plan Note (Signed)
Low risk profile with high HDL Had recent labs which looked good--he will get me a copy No statin

## 2014-10-09 NOTE — Assessment & Plan Note (Signed)
Symptoms are now gone again with prednisone burst. I doubt seronegative RA but still a possibility Will have him slowly wean down on the prednisone and hope to get back to every other day

## 2014-11-12 ENCOUNTER — Encounter: Payer: Self-pay | Admitting: Family Medicine

## 2014-11-12 ENCOUNTER — Ambulatory Visit (INDEPENDENT_AMBULATORY_CARE_PROVIDER_SITE_OTHER): Payer: BC Managed Care – PPO | Admitting: Family Medicine

## 2014-11-12 VITALS — BP 142/84 | HR 84 | Temp 97.5°F | Wt 200.0 lb

## 2014-11-12 DIAGNOSIS — S41112A Laceration without foreign body of left upper arm, initial encounter: Secondary | ICD-10-CM

## 2014-11-12 DIAGNOSIS — S41119A Laceration without foreign body of unspecified upper arm, initial encounter: Secondary | ICD-10-CM | POA: Insufficient documentation

## 2014-11-12 DIAGNOSIS — Z23 Encounter for immunization: Secondary | ICD-10-CM

## 2014-11-12 NOTE — Progress Notes (Signed)
Pre visit review using our clinic review tool, if applicable. No additional management support is needed unless otherwise documented below in the visit note.  2 days ago he cut his arm on a piece of glass, accidentally.  Stopped with pressure.  Cleaned and covered in the meantime.  Here for check.   Meds, vitals, and allergies reviewed.   ROS: See HPI.  Otherwise, noncontributory.  nad L forearm with 2cm long shallow lac with adherent tissue, narrow area of non-apposed tissue but not bleeding.  Appears clean. Not ttp, not red, no drainage. Not contaminated.

## 2014-11-12 NOTE — Assessment & Plan Note (Signed)
Other than Tdap at visit, no intervention needed.  Routine cautions given to patient.  No indication for treatment other than covering with a bandaid.   Fu prn.  Level 2 visit due to brevity and lack of intervention.

## 2014-11-12 NOTE — Patient Instructions (Signed)
Keep it clean and covered.  Neosporin and a bandaid for now.  If any spreading redness or pain, then notify us.  Wash it gently with soapy water and pat dry.  Take care.

## 2014-11-26 ENCOUNTER — Other Ambulatory Visit: Payer: Self-pay | Admitting: Internal Medicine

## 2014-11-26 NOTE — Telephone Encounter (Signed)
Should be #60 x 5 Sorry --I wrote the wrong thing

## 2014-11-26 NOTE — Telephone Encounter (Signed)
Ok to fill 

## 2014-11-26 NOTE — Telephone Encounter (Signed)
rx sent to pharmacy by e-script  

## 2014-11-26 NOTE — Telephone Encounter (Signed)
Approved: #60 x 0 He should have appt around May to see how he is doing weaning this down again

## 2015-05-07 ENCOUNTER — Other Ambulatory Visit: Payer: Self-pay | Admitting: Internal Medicine

## 2015-05-08 NOTE — Telephone Encounter (Signed)
Ok to fill? Not on active med list

## 2015-05-08 NOTE — Telephone Encounter (Signed)
rx sent to pharmacy by e-script  

## 2015-05-08 NOTE — Telephone Encounter (Signed)
Approved:okay to fill each   #60gm x 2 for cream #10 x 0 for tabs

## 2015-05-17 ENCOUNTER — Other Ambulatory Visit: Payer: Self-pay | Admitting: Internal Medicine

## 2015-05-17 NOTE — Telephone Encounter (Signed)
Last prescribed on 04/19/12. Last seen on 11/12/14.

## 2015-05-17 NOTE — Telephone Encounter (Signed)
Approved:okay #2 x 1 He is due for physical in December or after--he should probably schedule soon

## 2015-07-20 ENCOUNTER — Other Ambulatory Visit: Payer: Self-pay | Admitting: Internal Medicine

## 2015-07-22 NOTE — Telephone Encounter (Signed)
Ok to fill 

## 2015-07-22 NOTE — Telephone Encounter (Signed)
Approved: okay #60 x 11

## 2015-08-27 ENCOUNTER — Telehealth: Payer: Self-pay | Admitting: Internal Medicine

## 2015-08-27 DIAGNOSIS — Z1211 Encounter for screening for malignant neoplasm of colon: Secondary | ICD-10-CM

## 2015-08-27 NOTE — Telephone Encounter (Signed)
Pt needs referral for colonoscopy.  Please call [phone] thnak you

## 2015-08-28 NOTE — Telephone Encounter (Signed)
Referral placed.

## 2015-11-03 HISTORY — PX: COLONOSCOPY: SHX174

## 2016-01-10 ENCOUNTER — Telehealth: Payer: Self-pay | Admitting: *Deleted

## 2016-01-10 ENCOUNTER — Telehealth: Payer: Self-pay | Admitting: Internal Medicine

## 2016-01-10 MED ORDER — KETOCONAZOLE 2 % EX CREA: TOPICAL_CREAM | CUTANEOUS | Status: DC

## 2016-01-10 MED ORDER — FLUCONAZOLE 100 MG PO TABS: ORAL_TABLET | ORAL | Status: DC

## 2016-01-10 NOTE — Telephone Encounter (Signed)
Pt contacted office stating that he occasionally contracts ringworm from his cats and is requesting a refill of "ringworm" medication. Pt states he is not aware of name but that "Dr Silvio Pate would know" as this is something he does frequently. Pt requesting a call back if OV required, if not, ok for med to be sent to pharmacy

## 2016-01-10 NOTE — Telephone Encounter (Signed)
Patient Name: Alexander Cohen  DOB: 07/20/1956    Initial Comment Caller states c/o ringworm, requests Rx for oral medication since Rx cream isn't working   Nurse Assessment  Nurse: Mallie Mussel, RN, Alveta Heimlich Date/Time (Eastern Time): 01/10/2016 9:34:21 AM  Confirm and document reason for call. If symptomatic, describe symptoms. You must click the next button to save text entered. ---Caller states that he has a ringworm on the right side of his right facial cheek. He has had this one for 3 days and he is using ketoconazole 2%. He has been applying it for 3 days. It is still continuing to grow.  Has the patient traveled out of the country within the last 30 days? ---No  Does the patient have any new or worsening symptoms? ---Yes  Will a triage be completed? ---Yes  Related visit to physician within the last 2 weeks? ---No  Does the PT have any chronic conditions? (i.e. diabetes, asthma, etc.) ---No  Is this a behavioral health or substance abuse call? 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 He would like to get the oral med ordered. I called the backline and did a warm transfer to [facility] for further assistance.   Disagree/Comply: Disagree  Disagree/Comply Reason: Disagree with instructions

## 2016-01-10 NOTE — Telephone Encounter (Signed)
Please let him know that I sent the prescription for the cream and pills He is overdue for his physical--have him set up in the next few months

## 2016-01-10 NOTE — Telephone Encounter (Signed)
Spoke to patient. He said he will schedule an appointment in June when the kids are out of school

## 2016-01-10 NOTE — Telephone Encounter (Signed)
See previous telephone encounter - meds have been phoned in.  Patient is aware.

## 2016-08-19 ENCOUNTER — Ambulatory Visit: Payer: BC Managed Care – PPO | Admitting: *Deleted

## 2016-08-19 VITALS — Ht 70.0 in | Wt 207.6 lb

## 2016-08-19 DIAGNOSIS — Z1211 Encounter for screening for malignant neoplasm of colon: Secondary | ICD-10-CM

## 2016-08-19 MED ORDER — NA SULFATE-K SULFATE-MG SULF 17.5-3.13-1.6 GM/177ML PO SOLN: 1.0000 | Freq: Once | ORAL | 0 refills | Status: AC

## 2016-08-22 ENCOUNTER — Other Ambulatory Visit: Payer: Self-pay | Admitting: Internal Medicine

## 2016-09-02 ENCOUNTER — Encounter: Payer: BC Managed Care – PPO | Admitting: Internal Medicine

## 2016-09-04 ENCOUNTER — Encounter: Payer: BC Managed Care – PPO | Admitting: Gastroenterology

## 2016-09-18 ENCOUNTER — Encounter: Payer: Self-pay | Admitting: Gastroenterology

## 2016-09-18 ENCOUNTER — Ambulatory Visit (AMBULATORY_SURGERY_CENTER): Payer: BC Managed Care – PPO | Admitting: Gastroenterology

## 2016-09-18 VITALS — BP 111/68 | HR 71 | Temp 95.5°F | Resp 17 | Ht 70.0 in | Wt 207.0 lb

## 2016-09-18 DIAGNOSIS — Z1212 Encounter for screening for malignant neoplasm of rectum: Secondary | ICD-10-CM | POA: Diagnosis not present

## 2016-09-18 DIAGNOSIS — Z1211 Encounter for screening for malignant neoplasm of colon: Secondary | ICD-10-CM

## 2016-09-18 DIAGNOSIS — K5730 Diverticulosis of large intestine without perforation or abscess without bleeding: Secondary | ICD-10-CM | POA: Diagnosis not present

## 2016-09-18 DIAGNOSIS — D125 Benign neoplasm of sigmoid colon: Secondary | ICD-10-CM | POA: Diagnosis not present

## 2016-09-18 DIAGNOSIS — D124 Benign neoplasm of descending colon: Secondary | ICD-10-CM | POA: Diagnosis not present

## 2016-09-18 MED ORDER — SODIUM CHLORIDE 0.9 % IV SOLN: 500.0000 mL | INTRAVENOUS | Status: DC

## 2016-09-18 NOTE — Op Note (Signed)
[facility] Patient Name: Alexander Cohen Procedure Date: 09/18/2016 8:17 AM MRN: [id number] Endoscopist: Milus Banister , MD Age: 60 Referring MD:  Date of Birth: [birth year] Gender: Male Account #: [id number] Procedure:                Colonoscopy Indications:              Screening for colorectal malignant neoplasm Medicines:                Monitored Anesthesia Care Procedure:                Pre-Anesthesia Assessment:                           - Prior to the procedure, a History and Physical                            was performed, and patient medications and                            allergies were reviewed. The patient's tolerance of                            previous anesthesia was also reviewed. The risks                            and benefits of the procedure and the sedation                            options and risks were discussed with the patient.                            All questions were answered, and informed consent                            was obtained. Prior Anticoagulants: The patient has                            taken no previous anticoagulant or antiplatelet                            agents. ASA Grade Assessment: II - A patient with                            mild systemic disease. After reviewing the risks                            and benefits, the patient was deemed in                            satisfactory condition to undergo the procedure.                           After obtaining informed consent, the colonoscope  was passed under direct vision. Throughout the                            procedure, the patient's blood pressure, pulse, and                            oxygen saturations were monitored continuously. The                            Model CF-HQ190L [id number]) scope was introduced                            through the anus and advanced to the the cecum,                            identified by  appendiceal orifice and ileocecal                            valve. The colonoscopy was performed without                            difficulty. The patient tolerated the procedure                            well. The quality of the bowel preparation was                            excellent. The ileocecal valve, appendiceal                            orifice, and rectum were photographed. Scope In: 8:19:27 AM Scope Out: 8:32:49 AM Scope Withdrawal Time: 0 hours 10 minutes 51 seconds  Total Procedure Duration: 0 hours 13 minutes 22 seconds  Findings:                 Four sessile polyps were found in the sigmoid colon                            and descending colon. The polyps were 1 to 2 mm in                            size. These polyps were removed with a cold snare.                            Resection was complete, but the polyp tissue was                            only partially retrieved (4 polyps removed, 3                            retrieved)                           A few medium-mouthed diverticula were found in  the                            entire colon.                           The exam was otherwise without abnormality on                            direct and retroflexion views. Complications:            No immediate complications. Estimated blood loss:                            None. Estimated Blood Loss:     Estimated blood loss: none. Impression:               - Four 1 to 2 mm polyps in the sigmoid colon and in                            the descending colon, removed with a cold snare.                            Complete resection. Partial retrieval.                           - Diverticulosis in the entire examined colon.                           - The examination was otherwise normal on direct                            and retroflexion views. Recommendation:           - Patient has a contact number available for                            emergencies. The signs  and symptoms of potential                            delayed complications were discussed with the                            patient. Return to normal activities tomorrow.                            Written discharge instructions were provided to the                            patient.                           - Resume previous diet.                           - Continue present medications.  You will receive a letter within 2-3 weeks with the                            pathology results and my final recommendations.                           If the polyp(s) is proven to be 'pre-cancerous' on                            pathology, you will need repeat colonoscopy in 3-5                            years. If the polyp(s) is NOT 'precancerous' on                            pathology then you should repeat colon cancer                            screening in 10 years with colonoscopy without need                            for colon cancer screening by any method prior to                            then (including stool testing). Milus Banister, MD 09/18/2016 8:35:56 AM This report has been signed electronically.

## 2016-09-18 NOTE — Progress Notes (Signed)
To recovery, report to Hodges, RN, VSS 

## 2016-09-18 NOTE — Progress Notes (Signed)
Called to room to assist during endoscopic procedure.  Patient ID and intended procedure confirmed with present staff. Received instructions for my participation in the procedure from the performing physician.  

## 2016-09-18 NOTE — Patient Instructions (Signed)
YOU HAD AN ENDOSCOPIC PROCEDURE TODAY AT THE [facility]:   Refer to the procedure report that was given to you for any specific questions about what was found during the examination.  If the procedure report does not answer your questions, please call your gastroenterologist to clarify.  If you requested that your care partner not be given the details of your procedure findings, then the procedure report has been included in a sealed envelope for you to review at your convenience later.  YOU SHOULD EXPECT: Some feelings of bloating in the abdomen. Passage of more gas than usual.  Walking can help get rid of the air that was put into your GI tract during the procedure and reduce the bloating. If you had a lower endoscopy (such as a colonoscopy or flexible sigmoidoscopy) you may notice spotting of blood in your stool or on the toilet paper. If you underwent a bowel prep for your procedure, you may not have a normal bowel movement for a few days.  Please Note:  You might notice some irritation and congestion in your nose or some drainage.  This is from the oxygen used during your procedure.  There is no need for concern and it should clear up in a day or so.  SYMPTOMS TO REPORT IMMEDIATELY:   Following lower endoscopy (colonoscopy or flexible sigmoidoscopy):  Excessive amounts of blood in the stool  Significant tenderness or worsening of abdominal pains  Swelling of the abdomen that is new, acute  Fever of 100F or higher   For urgent or emergent issues, a gastroenterologist can be reached at any hour by calling [phone].   DIET:  We do recommend a small meal at first, but then you may proceed to your regular diet.  Drink plenty of fluids but you should avoid alcoholic beverages for 24 hours. Try to increase the fiber in your diet, and drink plenty of water.  ACTIVITY:  You should plan to take it easy for the rest of today and you should NOT DRIVE or use heavy machinery until  tomorrow (because of the sedation medicines used during the test).    FOLLOW UP: Our staff will call the number listed on your records the next business day following your procedure to check on you and address any questions or concerns that you may have regarding the information given to you following your procedure. If we do not reach you, we will leave a message.  However, if you are feeling well and you are not experiencing any problems, there is no need to return our call.  We will assume that you have returned to your regular daily activities without incident.  If any biopsies were taken you will be contacted by phone or by letter within the next 1-3 weeks.  Please call us at [phone] if you have not heard about the biopsies in 3 weeks.    SIGNATURES/CONFIDENTIALITY: You and/or your care partner have signed paperwork which will be entered into your electronic medical record.  These signatures attest to the fact that that the information above on your After Visit Summary has been reviewed and is understood.  Full responsibility of the confidentiality of this discharge information lies with you and/or your care-partner.  Read all of the handouts given to you by your recovery room nurse.  Thank-you for choosing us for your healthcare needs today. 

## 2016-09-21 ENCOUNTER — Telehealth: Payer: Self-pay

## 2016-09-21 NOTE — Telephone Encounter (Signed)
  Follow up Call-  Call back number 09/18/2016  Post procedure Call Back phone  # [phone]  Permission to leave phone message Yes  Some recent data might be hidden     Patient questions:  Do you have a fever, pain , or abdominal swelling? No. Pain Score  0 *  Have you tolerated food without any problems? Yes.    Have you been able to return to your normal activities? Yes.    Do you have any questions about your discharge instructions: Diet   No. Medications  No. Follow up visit  No.  Do you have questions or concerns about your Care? No.  Actions: * If pain score is 4 or above: No action needed, pain <4.

## 2016-09-23 ENCOUNTER — Encounter: Payer: Self-pay | Admitting: Gastroenterology

## 2016-11-27 ENCOUNTER — Ambulatory Visit (INDEPENDENT_AMBULATORY_CARE_PROVIDER_SITE_OTHER): Payer: BC Managed Care – PPO | Admitting: Internal Medicine

## 2016-11-27 ENCOUNTER — Encounter: Payer: Self-pay | Admitting: Internal Medicine

## 2016-11-27 VITALS — BP 104/76 | HR 80 | Temp 97.5°F | Ht 70.0 in | Wt 206.0 lb

## 2016-11-27 DIAGNOSIS — B351 Tinea unguium: Secondary | ICD-10-CM | POA: Diagnosis not present

## 2016-11-27 DIAGNOSIS — M353 Polymyalgia rheumatica: Secondary | ICD-10-CM | POA: Diagnosis not present

## 2016-11-27 DIAGNOSIS — Z125 Encounter for screening for malignant neoplasm of prostate: Secondary | ICD-10-CM | POA: Diagnosis not present

## 2016-11-27 DIAGNOSIS — Z0000 Encounter for general adult medical examination without abnormal findings: Secondary | ICD-10-CM

## 2016-11-27 LAB — LIPID PANEL
CHOL/HDL RATIO: 4
Cholesterol: 245 mg/dL — ABNORMAL HIGH (ref 0–200)
HDL: 69.5 mg/dL (ref >39.00)
LDL CALC: 160 mg/dL — AB (ref 0–99)
NonHDL: 175.55
Triglycerides: 79 mg/dL (ref 0.0–149.0)
VLDL: 15.8 mg/dL (ref 0.0–40.0)

## 2016-11-27 LAB — COMPREHENSIVE METABOLIC PANEL
ALT: 29 U/L (ref 0–53)
AST: 20 U/L (ref 0–37)
Albumin: 3.9 g/dL (ref 3.5–5.2)
Alkaline Phosphatase: 39 U/L (ref 39–117)
BUN: 24 mg/dL — AB (ref 6–23)
CHLORIDE: 105 meq/L (ref 96–112)
CO2: 32 meq/L (ref 19–32)
CREATININE: 0.98 mg/dL (ref 0.40–1.50)
Calcium: 9.4 mg/dL (ref 8.4–10.5)
GFR: 82.15 mL/min (ref >60.00)
GLUCOSE: 94 mg/dL (ref 70–99)
Potassium: 4.8 mEq/L (ref 3.5–5.1)
SODIUM: 139 meq/L (ref 135–145)
Total Bilirubin: 0.8 mg/dL (ref 0.2–1.2)
Total Protein: 6.5 g/dL (ref 6.0–8.3)

## 2016-11-27 LAB — CBC WITH DIFFERENTIAL/PLATELET
BASOS ABS: 0 10*3/uL (ref 0.0–0.1)
Basophils Relative: 0.4 % (ref 0.0–3.0)
Eosinophils Absolute: 0.5 10*3/uL (ref 0.0–0.7)
Eosinophils Relative: 7.5 % — ABNORMAL HIGH (ref 0.0–5.0)
HEMATOCRIT: 45.9 % (ref 39.0–52.0)
Hemoglobin: 15.8 g/dL (ref 13.0–17.0)
LYMPHS PCT: 29.8 % (ref 12.0–46.0)
Lymphs Abs: 1.9 10*3/uL (ref 0.7–4.0)
MCHC: 34.5 g/dL (ref 30.0–36.0)
MCV: 90.4 fl (ref 78.0–100.0)
MONOS PCT: 7.3 % (ref 3.0–12.0)
Monocytes Absolute: 0.5 10*3/uL (ref 0.1–1.0)
NEUTROS ABS: 3.4 10*3/uL (ref 1.4–7.7)
Neutrophils Relative %: 55 % (ref 43.0–77.0)
PLATELETS: 228 10*3/uL (ref 150.0–400.0)
RBC: 5.08 Mil/uL (ref 4.22–5.81)
RDW: 13.1 % (ref 11.5–15.5)
WBC: 6.2 10*3/uL (ref 4.0–10.5)

## 2016-11-27 LAB — SEDIMENTATION RATE: Sed Rate: 3 mm/hr (ref 0–20)

## 2016-11-27 LAB — PSA: PSA: 5.4 ng/mL — ABNORMAL HIGH (ref 0.10–4.00)

## 2016-11-27 MED ORDER — TERBINAFINE HCL 250 MG PO TABS: 250.0000 mg | ORAL_TABLET | Freq: Every day | ORAL | 0 refills | Status: DC

## 2016-11-27 NOTE — Patient Instructions (Signed)
If your sed rate is normal or near normal, decrease the prednisone to 5mg  alternating with 2.5mg  every other day for 1 month. If no increase in muscle pain, then decrease to 5mg  every other day for 1 month--then 2.5 mg every other day for a month--then stop.

## 2016-11-27 NOTE — Progress Notes (Signed)
Pre visit review using our clinic review tool, if applicable. No additional management support is needed unless otherwise documented below in the visit note. 

## 2016-11-27 NOTE — Assessment & Plan Note (Signed)
Healthy Will check PSA after discussion Recent colon Keeps fit

## 2016-11-27 NOTE — Assessment & Plan Note (Signed)
Will try lamisil

## 2016-11-27 NOTE — Progress Notes (Signed)
Subjective:    Patient ID: Alexander Cohen, male    DOB: [birth year], 61 y.o.   MRN: [id number]  HPI Here for physical  He has been doing well in general He is doing well with the myalgia--none noted Some expected joint issues Down to 5mg  daily with the prednisone  He is concerned about his toenail fungus Interested in treatment Occasionally painful  Continues to work out hard--aerobic and [employer] training  Current Outpatient Prescriptions on File Prior to Visit  Medication Sig Dispense Refill  . EPIPEN 2-PAK 0.3 MG/0.3ML SOAJ injection INJECT 1 PEN SUBCUTANEOUSLY AS DIRECTED 1 Device 1  . ketoconazole (NIZORAL) 2 % cream APPLY 1 APPLICATION TOPICALLY DAILY. 60 g 0  . predniSONE (DELTASONE) 5 MG tablet TAKE 2 TABLETS (10MG  TOTAL) BY MOUTH EVERY OTHER DAY (Patient taking differently: 1 tablet daily) 60 tablet 3   No current facility-administered medications on file prior to visit.     No Known Allergies  Past Medical History:  Diagnosis Date  . ADHD (attention deficit hyperactivity disorder), inattentive type   . Anxiety   . Hyperlipidemia   . Nasal polyp   . Osteoarthritis     Past Surgical History:  Procedure Laterality Date  . INGUINAL HERNIA REPAIR  09/1999   (Dr. Quillian Quince)  . KNEE ARTHROSCOPY  1974   Right  . KNEE CARTILAGE SURGERY  1973   Left    Family History  Problem Relation Age of Onset  . Alcohol abuse Mother   . Alcohol abuse Father   . Diabetes Maternal Grandmother   . Colon cancer Neg Hx     Social History   Social History  . Marital status: Married    Spouse name: N/A  . Number of children: 3  . Years of education: N/A   Occupational History  . [employer]    Social History Main Topics  . Smoking status: Never Smoker  . Smokeless tobacco: Never Used  . Alcohol use 2.4 oz/week    4 Glasses of wine per week  . Drug use: No  . Sexual activity: Not on file   Other Topics Concern  . Not on  file   Social History Narrative  . No narrative on file   Review of Systems  Constitutional:       Weight up slightly Wears seat belt  HENT: Positive for hearing loss. Negative for dental problem and tinnitus.        Keeps up with dentist  Eyes: Negative for visual disturbance.       No diplopia or unilateral vision loss  Respiratory: Negative for cough, chest tightness and shortness of breath.   Cardiovascular: Negative for chest pain, palpitations and leg swelling.  Gastrointestinal: Negative for abdominal pain, blood in stool, constipation, nausea and vomiting.       No heartburn  Endocrine: Negative for polydipsia and polyuria.  Genitourinary: Negative for difficulty urinating, frequency and urgency.       No sexual problems  Musculoskeletal: Positive for arthralgias. Negative for back pain and joint swelling.  Skin: Negative for rash.       No suspicious lesions  Allergic/Immunologic: Negative for environmental allergies and immunocompromised state.  Neurological: Negative for dizziness, syncope, light-headedness and headaches.  Hematological: Negative for adenopathy. Does not bruise/bleed easily.  Psychiatric/Behavioral: Positive for sleep disturbance. Negative for dysphoric mood. The patient is not nervous/anxious.        Only 6 hours a night at  most       Objective:   Physical Exam  Constitutional: He is oriented to person, place, and time. He appears well-developed and well-nourished. No distress.  HENT:  Head: Normocephalic and atraumatic.  Right Ear: External ear normal.  Left Ear: External ear normal.  Mouth/Throat: Oropharynx is clear and moist. No oropharyngeal exudate.  Eyes: Conjunctivae are normal. Pupils are equal, round, and reactive to light.  Neck: Normal range of motion. Neck supple. No thyromegaly present.  Cardiovascular: Normal rate, regular rhythm, normal heart sounds and intact distal pulses.  Exam reveals no gallop.   No murmur  heard. Pulmonary/Chest: Effort normal and breath sounds normal. No respiratory distress. He has no wheezes. He has no rales.  Abdominal: Soft. There is no tenderness.  Musculoskeletal: He exhibits no edema or tenderness.  Lymphadenopathy:    He has no cervical adenopathy.  Neurological: He is alert and oriented to person, place, and time.  Skin: No rash noted.  Severely mycotic toenails-- 7/10  Psychiatric: He has a normal mood and affect. His behavior is normal.          Assessment & Plan:

## 2016-11-27 NOTE — Assessment & Plan Note (Signed)
Seems to be in remission Will have him try to wean off prednisone if sed rate still normal/near normal

## 2016-11-28 ENCOUNTER — Other Ambulatory Visit: Payer: Self-pay | Admitting: Internal Medicine

## 2016-11-28 DIAGNOSIS — R972 Elevated prostate specific antigen [PSA]: Secondary | ICD-10-CM

## 2017-01-04 ENCOUNTER — Other Ambulatory Visit (INDEPENDENT_AMBULATORY_CARE_PROVIDER_SITE_OTHER): Payer: BC Managed Care – PPO

## 2017-01-04 DIAGNOSIS — B351 Tinea unguium: Secondary | ICD-10-CM | POA: Diagnosis not present

## 2017-01-04 DIAGNOSIS — R972 Elevated prostate specific antigen [PSA]: Secondary | ICD-10-CM

## 2017-01-04 LAB — HEPATIC FUNCTION PANEL
ALT: 27 U/L (ref 0–53)
AST: 20 U/L (ref 0–37)
Albumin: 3.8 g/dL (ref 3.5–5.2)
Alkaline Phosphatase: 44 U/L (ref 39–117)
BILIRUBIN DIRECT: 0.1 mg/dL (ref 0.0–0.3)
TOTAL PROTEIN: 6.4 g/dL (ref 6.0–8.3)
Total Bilirubin: 0.4 mg/dL (ref 0.2–1.2)

## 2017-01-05 LAB — PSA, TOTAL AND FREE
PSA, % Free: 20 % — ABNORMAL LOW (ref >25)
PSA, Free: 0.6 ng/mL
PSA, Total: 3 ng/mL (ref <=4.0)

## 2017-02-15 ENCOUNTER — Other Ambulatory Visit: Payer: Self-pay | Admitting: Internal Medicine

## 2017-02-15 DIAGNOSIS — R972 Elevated prostate specific antigen [PSA]: Secondary | ICD-10-CM

## 2017-02-24 ENCOUNTER — Encounter: Payer: Self-pay | Admitting: Family Medicine

## 2017-02-24 ENCOUNTER — Ambulatory Visit (INDEPENDENT_AMBULATORY_CARE_PROVIDER_SITE_OTHER)
Admission: RE | Admit: 2017-02-24 | Discharge: 2017-02-24 | Disposition: A | Discharge status: Home / Self Care | Payer: BC Managed Care – PPO | Source: Ambulatory Visit | Attending: Family Medicine | Admitting: Family Medicine

## 2017-02-24 ENCOUNTER — Ambulatory Visit (INDEPENDENT_AMBULATORY_CARE_PROVIDER_SITE_OTHER): Payer: BC Managed Care – PPO | Admitting: Family Medicine

## 2017-02-24 VITALS — BP 118/76 | HR 73 | Temp 97.5°F | Wt 203.0 lb

## 2017-02-24 DIAGNOSIS — M25551 Pain in right hip: Secondary | ICD-10-CM

## 2017-02-24 MED ORDER — PREDNISONE 20 MG PO TABS: ORAL_TABLET | ORAL | 0 refills | Status: DC

## 2017-02-24 MED ORDER — CYCLOBENZAPRINE HCL 10 MG PO TABS: 10.0000 mg | ORAL_TABLET | Freq: Every evening | ORAL | 0 refills | Status: DC | PRN

## 2017-02-24 NOTE — Progress Notes (Signed)
Pre visit review using our clinic review tool, if applicable. No additional management support is needed unless otherwise documented below in the visit note. 

## 2017-02-24 NOTE — Patient Instructions (Signed)
I have sent prednisone taper and muscle relaxer to your pharmacy- stop your prednisone 2.5 mg while on taper  Continue gentle stretching  You will be called about xray result today  Please let us know if pain gets worse  Follow up with Dr. Silvio Pate or Dr. Lorelei Pont (sports medicine) in 1 week

## 2017-02-24 NOTE — Progress Notes (Signed)
Subjective:    Patient ID: Alexander Cohen, male    DOB: Sep 10, 1956, 61 y.o.   MRN: [id number]  HPI This is a 61 yo male who presents today with right leg pain. Started with dull ache over right hip 4 days ago. No known trauma or falls. Pain has gotten progressively worse with pain down leg, worse over right hip and lateral side of right knee. Pain is constant, feels like hot poker. Took wife's Etodolac and some Advil without relief. Tried heat, ice, massage without relief. Some intermittent numbness and tingling with walking. No incontinence of bowel or bladder, no UE symptoms, no neck pain.   Past Medical History:  Diagnosis Date  . ADHD (attention deficit hyperactivity disorder), inattentive type   . Anxiety   . Hyperlipidemia   . Nasal polyp   . Osteoarthritis    Past Surgical History:  Procedure Laterality Date  . INGUINAL HERNIA REPAIR  09/1999   (Dr. Quillian Quince)  . KNEE ARTHROSCOPY  1974   Right  . KNEE CARTILAGE SURGERY  1973   Left   Family History  Problem Relation Age of Onset  . Alcohol abuse Mother   . Alcohol abuse Father   . Diabetes Maternal Grandmother   . Colon cancer Neg Hx    Social History  Substance Use Topics  . Smoking status: Never Smoker  . Smokeless tobacco: Never Used  . Alcohol use 2.4 oz/week    4 Glasses of wine per week     Review of Systems Per eHPI    Objective:   Physical Exam  Constitutional: He is oriented to person, place, and time. He appears well-developed and well-nourished. No distress.  HENT:  Head: Normocephalic and atraumatic.  Mouth/Throat: Oropharynx is clear and moist.  Eyes: Pupils are equal, round, and reactive to light.  Neck: Normal range of motion. Neck supple.  Cardiovascular: Normal rate.   Pulmonary/Chest: Effort normal.  Musculoskeletal: Normal range of motion.       Right hip: He exhibits tenderness (right glutteal). He exhibits normal range of motion, normal strength and no bony tenderness.   Cervical back: Normal.       Thoracic back: Normal.       Lumbar back: Normal.  LE/UE strenght 5/5 Full ROM of right hip with improvement of pain with external rotation.  Negative straight leg raise.  Used wife's walker to ambulate into building, was able to ambulate to xray without assistant device.   Neurological: He is alert and oriented to person, place, and time. He has normal reflexes.  Skin: Skin is warm and dry. He is not diaphoretic.  Psychiatric: He has a normal mood and affect. His behavior is normal. Judgment and thought content normal.  Vitals reviewed.     BP 118/76 (BP Location: Left Arm, Patient Position: Sitting, Cuff Size: Normal)   Pulse 73   Temp 97.5 F (36.4 C) (Oral)   Wt 203 lb (92.1 kg)   SpO2 98%   BMI 29.13 kg/m  Wt Readings from Last 3 Encounters:  02/24/17 203 lb (92.1 kg)  11/27/16 206 lb (93.4 kg)  09/18/16 207 lb (93.9 kg)       Assessment & Plan:  1. Acute right hip pain - exam reassuring, pain improved with manipulation of leg/hip, will check xray - continue gentle stretching several times a day, RTC precautions reviewed, f/u Dr. Silvio Pate or Dr. Lorelei Pont if xray ok and no improvement in 1 week. - DG HIP UNILAT W OR W/O  PELVIS 2-3 VIEWS RIGHT; Future - predniSONE (DELTASONE) 20 MG tablet; Take 3 tablets x 2 days, then 2x2, then 1x2  Dispense: 12 tablet; Refill: 0 - cyclobenzaprine (FLEXERIL) 10 MG tablet; Take 1 tablet (10 mg total) by mouth at bedtime as needed for muscle spasms.  Dispense: 10 tablet; Refill: 0   Clarene Reamer, FNP-BC  [facility]  02/24/2017 1:02 PM

## 2017-02-25 ENCOUNTER — Other Ambulatory Visit: Payer: BC Managed Care – PPO

## 2017-06-07 ENCOUNTER — Other Ambulatory Visit: Payer: Self-pay | Admitting: Family Medicine

## 2017-06-07 DIAGNOSIS — M25551 Pain in right hip: Secondary | ICD-10-CM

## 2017-06-14 NOTE — Telephone Encounter (Signed)
Okay to refill the 5mg  daily  #100 x 3 If he does well over 1-2 months, can try to go down to 5 alternating with 2.5mg  for a month and then 5mg  every other day (if tolerated)

## 2017-06-14 NOTE — Telephone Encounter (Signed)
He was on prednisone chronically for PMR but was trying to wean off as of 1/18 If he was off, the recent Rx was just for acute pain and doesn't need refill Confirm with him that he was off the prednisone before this last visit (had been on 10mg  every other day at time of 1/18 appt)

## 2017-06-14 NOTE — Telephone Encounter (Signed)
Faxed received from [facility] for Prednisone refill. Saw Tor Netters 02-24-17 for Hip Pain.

## 2017-06-14 NOTE — Telephone Encounter (Signed)
Spoke to pt. He said he was weaning off to the end of June. He had a relapse of symptoms in late July and was out of town. Took some of the 5mg  he had a home and started feeling better right away. He is doing 5mg  daily.

## 2017-06-17 MED ORDER — PREDNISONE 5 MG PO TABS: ORAL_TABLET | ORAL | 3 refills | Status: DC

## 2017-06-17 NOTE — Telephone Encounter (Signed)
Pt left v/m requesting refill prednisone; if cannot refill without being seen pt request an appt for 06/17/17 because pt has one day left and will be in serious trouble without med. [name] did pt get cb from 06/14/17 note from Dr Silvio Pate? Did not see on med list.Please advise.

## 2017-06-17 NOTE — Telephone Encounter (Signed)
Spoke to pt. I had tried to call him Monday to let him know the schedule of the medication. I have sent in the rx.

## 2017-09-29 ENCOUNTER — Ambulatory Visit: Payer: BC Managed Care – PPO | Admitting: Internal Medicine

## 2017-09-29 ENCOUNTER — Encounter: Payer: Self-pay | Admitting: Internal Medicine

## 2017-09-29 VITALS — BP 124/82 | HR 73 | Temp 98.3°F | Ht 70.0 in | Wt 210.0 lb

## 2017-09-29 DIAGNOSIS — Z23 Encounter for immunization: Secondary | ICD-10-CM | POA: Diagnosis not present

## 2017-09-29 DIAGNOSIS — L03211 Cellulitis of face: Secondary | ICD-10-CM | POA: Diagnosis not present

## 2017-09-29 MED ORDER — SULFAMETHOXAZOLE-TRIMETHOPRIM 800-160 MG PO TABS: 1.0000 | ORAL_TABLET | Freq: Two times a day (BID) | ORAL | 0 refills | Status: AC

## 2017-09-29 MED ORDER — RIFAMPIN 300 MG PO CAPS: 300.0000 mg | ORAL_CAPSULE | Freq: Two times a day (BID) | ORAL | 0 refills | Status: AC

## 2017-09-29 NOTE — Patient Instructions (Signed)

## 2017-09-29 NOTE — Progress Notes (Signed)
Subjective:  Patient ID: Alexander Cohen, male    DOB: 1956/03/23  Age: 61 y.o. MRN: [id number]  CC: Rash  NEW TO ME  HPI Efton Thomley Hazzard presents for the complaint of a red, swollen, painful area in his mustache just below the middle of his nose.  This has been worsening over the last few days.  He has had this happen before and it was treated for a staph infection.  He got desperate a day or 2 prior to this appointment and started taking his wife's supply of nitrofurantoin which he thinks has helped some.  Outpatient Medications Prior to Visit  Medication Sig Dispense Refill  . predniSONE (DELTASONE) 5 MG tablet 1 by mouth daily for 1-2 months. Then 5mg  alternating with 2.5mg  for a month. Then 5mg  every other day 100 tablet 3  . ketoconazole (NIZORAL) 2 % cream APPLY 1 APPLICATION TOPICALLY DAILY. 60 g 0  . EPIPEN 2-PAK 0.3 MG/0.3ML SOAJ injection INJECT 1 PEN SUBCUTANEOUSLY AS DIRECTED (Patient not taking: Reported on 09/29/2017) 1 Device 1  . cyclobenzaprine (FLEXERIL) 10 MG tablet Take 1 tablet (10 mg total) by mouth at bedtime as needed for muscle spasms. 10 tablet 0  . predniSONE (DELTASONE) 20 MG tablet Take 3 tablets x 2 days, then 2x2, then 1x2 12 tablet 0  . terbinafine (LAMISIL) 250 MG tablet Take 1 tablet (250 mg total) by mouth daily. 90 tablet 0   No facility-administered medications prior to visit.     ROS Review of Systems  Constitutional: Negative.  Negative for chills, fatigue and fever.  HENT: Negative.   Eyes: Negative.  Negative for photophobia.  Respiratory: Negative.  Negative for cough, chest tightness and wheezing.   Cardiovascular: Negative.  Negative for chest pain, palpitations and leg swelling.  Gastrointestinal: Negative for abdominal pain, constipation, diarrhea, nausea and vomiting.  Endocrine: Negative.   Genitourinary: Negative.   Musculoskeletal: Negative for back pain and neck pain.  Skin: Positive for color change and rash. Negative for  pallor and wound.  Allergic/Immunologic: Negative.   Neurological: Negative.   Hematological: Negative for adenopathy. Does not bruise/bleed easily.  Psychiatric/Behavioral: Negative.     Objective:  BP 124/82 (BP Location: Left Arm, Patient Position: Sitting, Cuff Size: Normal)   Pulse 73   Temp 98.3 F (36.8 C) (Oral)   Ht 5\' 10"  (1.778 m)   Wt 210 lb (95.3 kg)   SpO2 98%   BMI 30.13 kg/m   BP Readings from Last 3 Encounters:  09/29/17 124/82  02/24/17 118/76  11/27/16 104/76    Wt Readings from Last 3 Encounters:  09/29/17 210 lb (95.3 kg)  02/24/17 203 lb (92.1 kg)  11/27/16 206 lb (93.4 kg)    Physical Exam  Constitutional: He is oriented to person, place, and time. No distress.  HENT:  Mouth/Throat: Oropharynx is clear and moist. No oropharyngeal exudate.  Eyes: Conjunctivae are normal. Right eye exhibits no discharge. Left eye exhibits no discharge. No scleral icterus.  Neck: Normal range of motion. Neck supple. No JVD present. No thyromegaly present.  Cardiovascular: Normal rate, regular rhythm and intact distal pulses.  No murmur heard. Pulmonary/Chest: Effort normal and breath sounds normal. No respiratory distress. He has no wheezes. He has no rales. He exhibits no tenderness.  Abdominal: Soft. Bowel sounds are normal. He exhibits no distension and no mass. There is no tenderness. There is no rebound and no guarding.  Musculoskeletal: Normal range of motion. He exhibits no edema or tenderness.  Lymphadenopathy:    He has no cervical adenopathy.  Neurological: He is alert and oriented to person, place, and time.  Skin: Skin is warm. He is not diaphoretic. There is erythema.  In the midline, just under the nasal columella there is a 1 cm area of erythema, tenderness, small pustules, and warmth.  There is no induration or fluctuance.  Vitals reviewed.   Lab Results  Component Value Date   WBC 6.2 11/27/2016   HGB 15.8 11/27/2016   HCT 45.9 11/27/2016    PLT 228.0 11/27/2016   GLUCOSE 94 11/27/2016   CHOL 245 (H) 11/27/2016   TRIG 79.0 11/27/2016   HDL 69.50 11/27/2016   LDLDIRECT 156.3 09/15/2013   LDLCALC 160 (H) 11/27/2016   ALT 27 01/04/2017   AST 20 01/04/2017   NA 139 11/27/2016   K 4.8 11/27/2016   CL 105 11/27/2016   CREATININE 0.98 11/27/2016   BUN 24 (H) 11/27/2016   CO2 32 11/27/2016   TSH 2.17 09/15/2013   PSA 5.40 (H) 11/27/2016    Dg Hip Unilat W Or W/o Pelvis 2-3 Views Right  Result Date: 02/24/2017 CLINICAL DATA:  Sudden onset of RIGHT hip pain 4 days ago, no trauma EXAM: DG HIP (WITH OR WITHOUT PELVIS) 2-3V RIGHT COMPARISON:  None FINDINGS: Osseous mineralization normal for technique. Hip and SI joint spaces symmetric and preserved. No acute fracture, dislocation, or bone destruction. Question bone island in the RIGHT ilium. IMPRESSION: No acute osseous abnormalities. Electronically Signed   By: Lavonia Dana M.D.   On: 02/24/2017 09:41    Assessment & Plan:   Dyllin was seen today for rash.  Diagnoses and all orders for this visit:  Need for influenza vaccination -     Flu Vaccine QUAD 36+ mos IM  Facial cellulitis- This is concerning for MRSA or another form of staph.  Will treat with double antibiotic coverage including Bactrim DS and rifampin. -     sulfamethoxazole-trimethoprim (BACTRIM DS,SEPTRA DS) 800-160 MG tablet; Take 1 tablet by mouth 2 (two) times daily for 7 days. -     rifampin (RIFADIN) 300 MG capsule; Take 1 capsule (300 mg total) by mouth 2 (two) times daily for 7 days.   I have discontinued Iona Beard A. Coach's ketoconazole, terbinafine, and cyclobenzaprine. I am also having him start on sulfamethoxazole-trimethoprim and rifampin. Additionally, I am having him maintain his EPIPEN 2-PAK and predniSONE.  Meds ordered this encounter  Medications  . sulfamethoxazole-trimethoprim (BACTRIM DS,SEPTRA DS) 800-160 MG tablet    Sig: Take 1 tablet by mouth 2 (two) times daily for 7 days.    Dispense:   14 tablet    Refill:  0  . rifampin (RIFADIN) 300 MG capsule    Sig: Take 1 capsule (300 mg total) by mouth 2 (two) times daily for 7 days.    Dispense:  14 capsule    Refill:  0     Follow-up: Return in about 1 week (around 10/06/2017).  Scarlette Calico, MD

## 2017-11-29 IMAGING — DX DG HIP (WITH OR WITHOUT PELVIS) 2-3V*R*
3 series · 3 of 3 positions shown · non-contrast
Comparison: None

CLINICAL DATA: Sudden onset of RIGHT hip pain 4 days ago, no trauma

EXAM:
DG HIP (WITH OR WITHOUT PELVIS) 2-3V RIGHT

[pelvis ap]
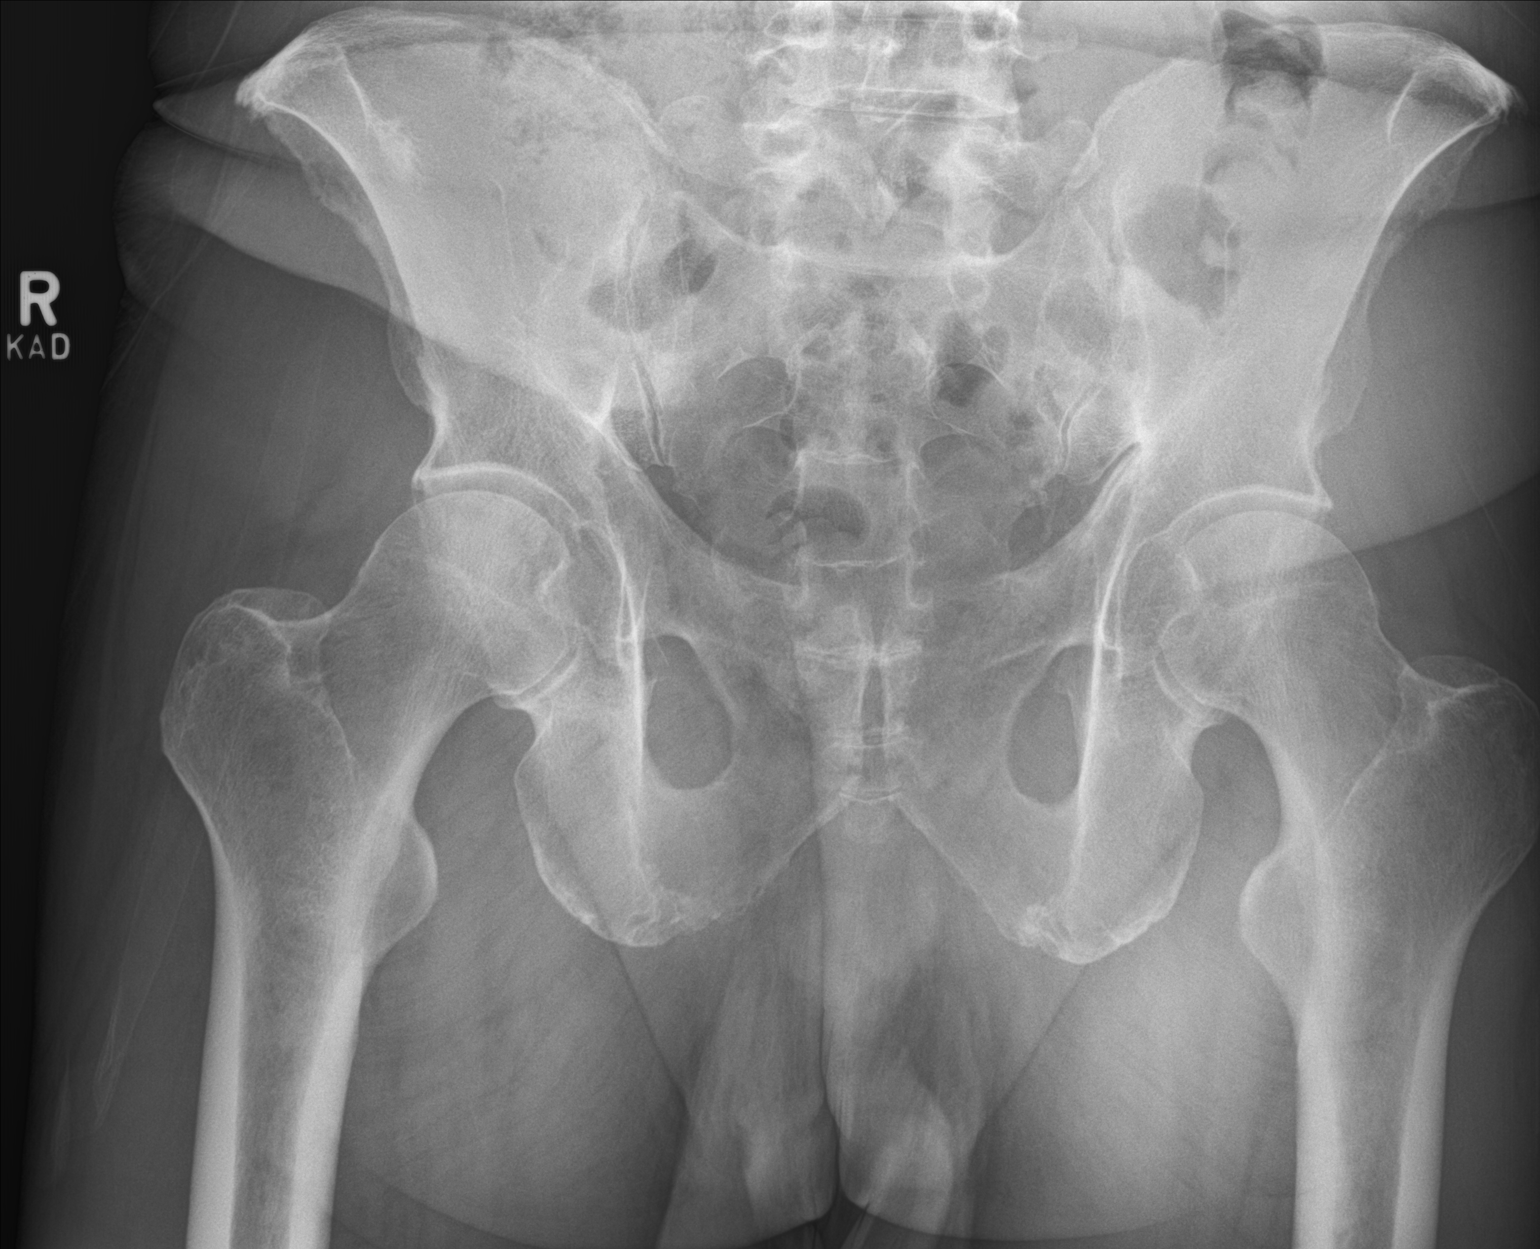

[hip ap]
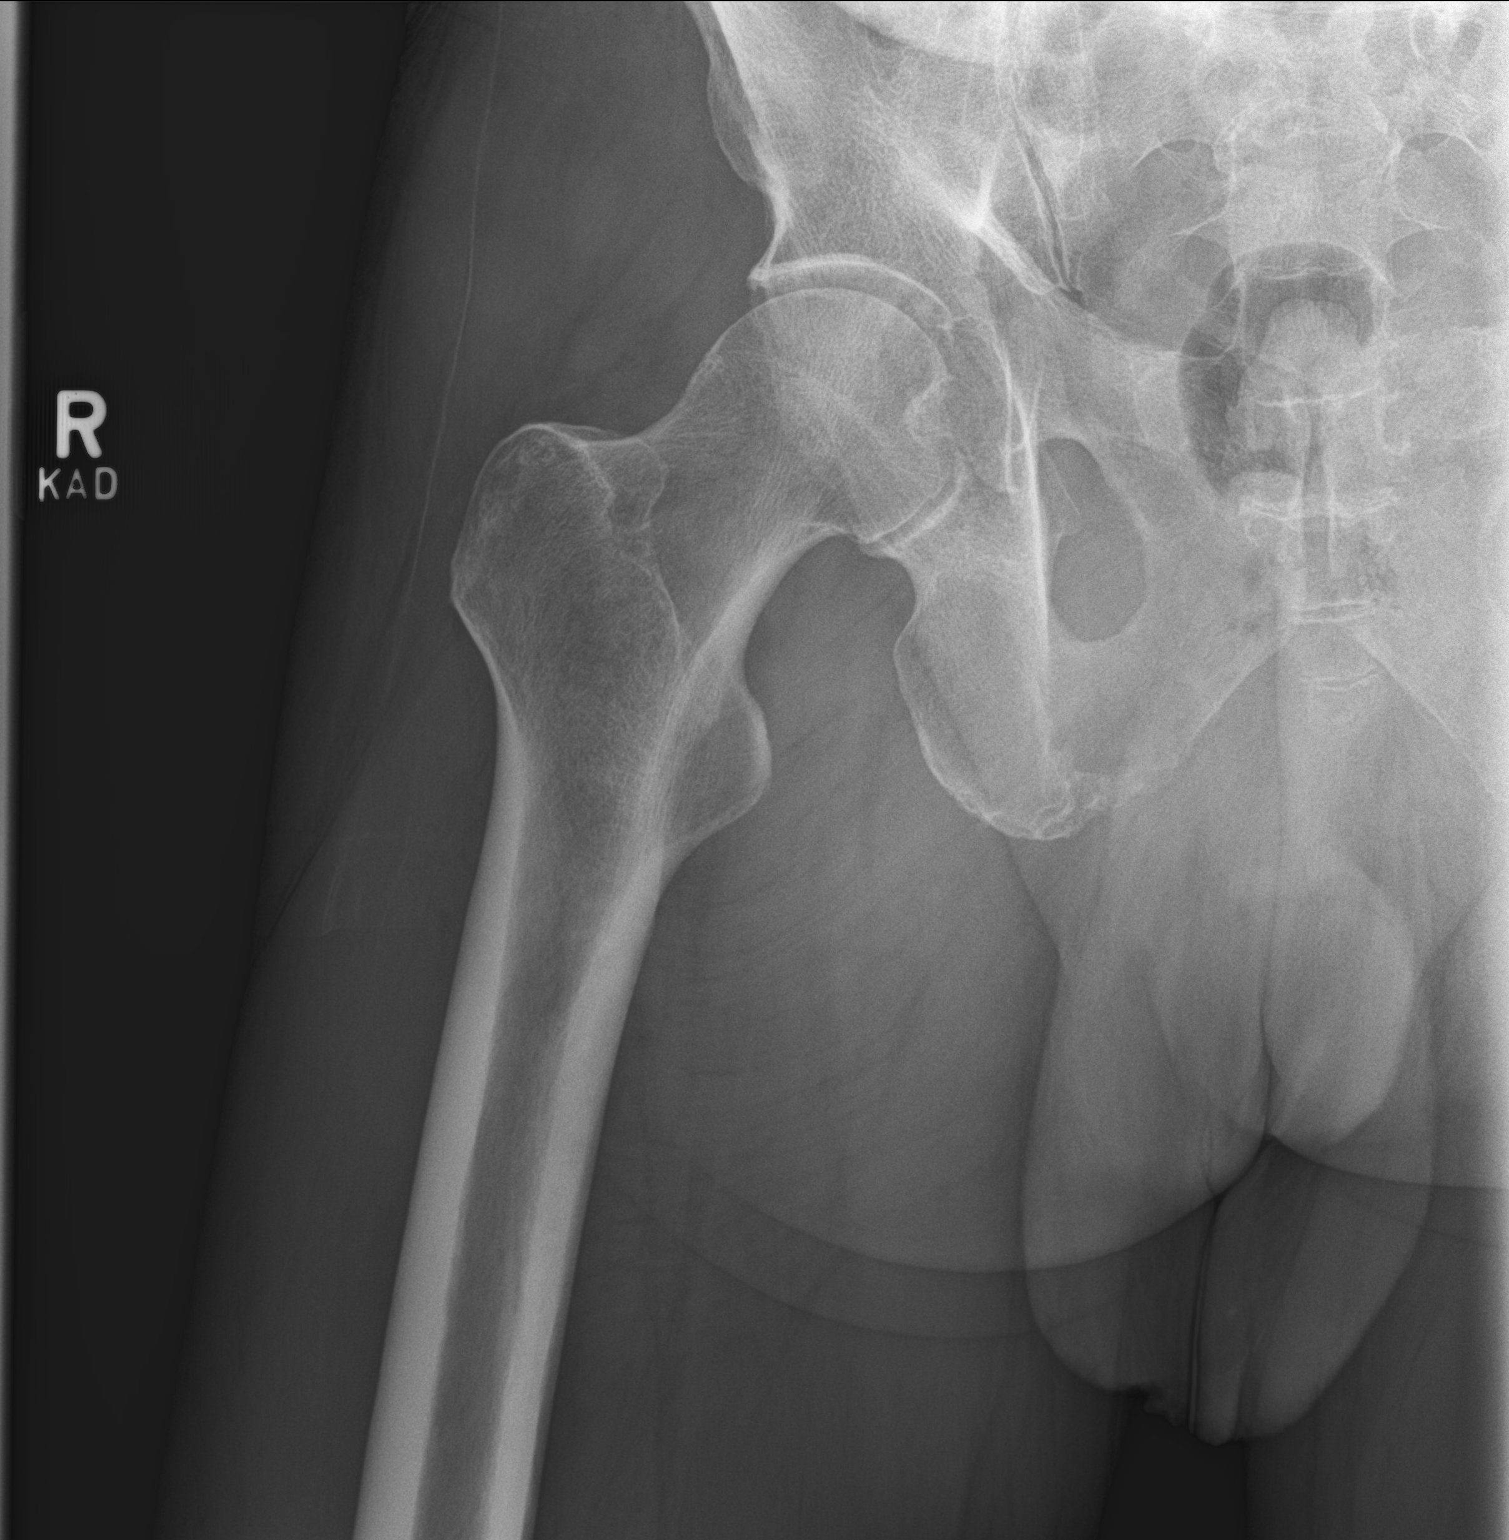

[hip lat]
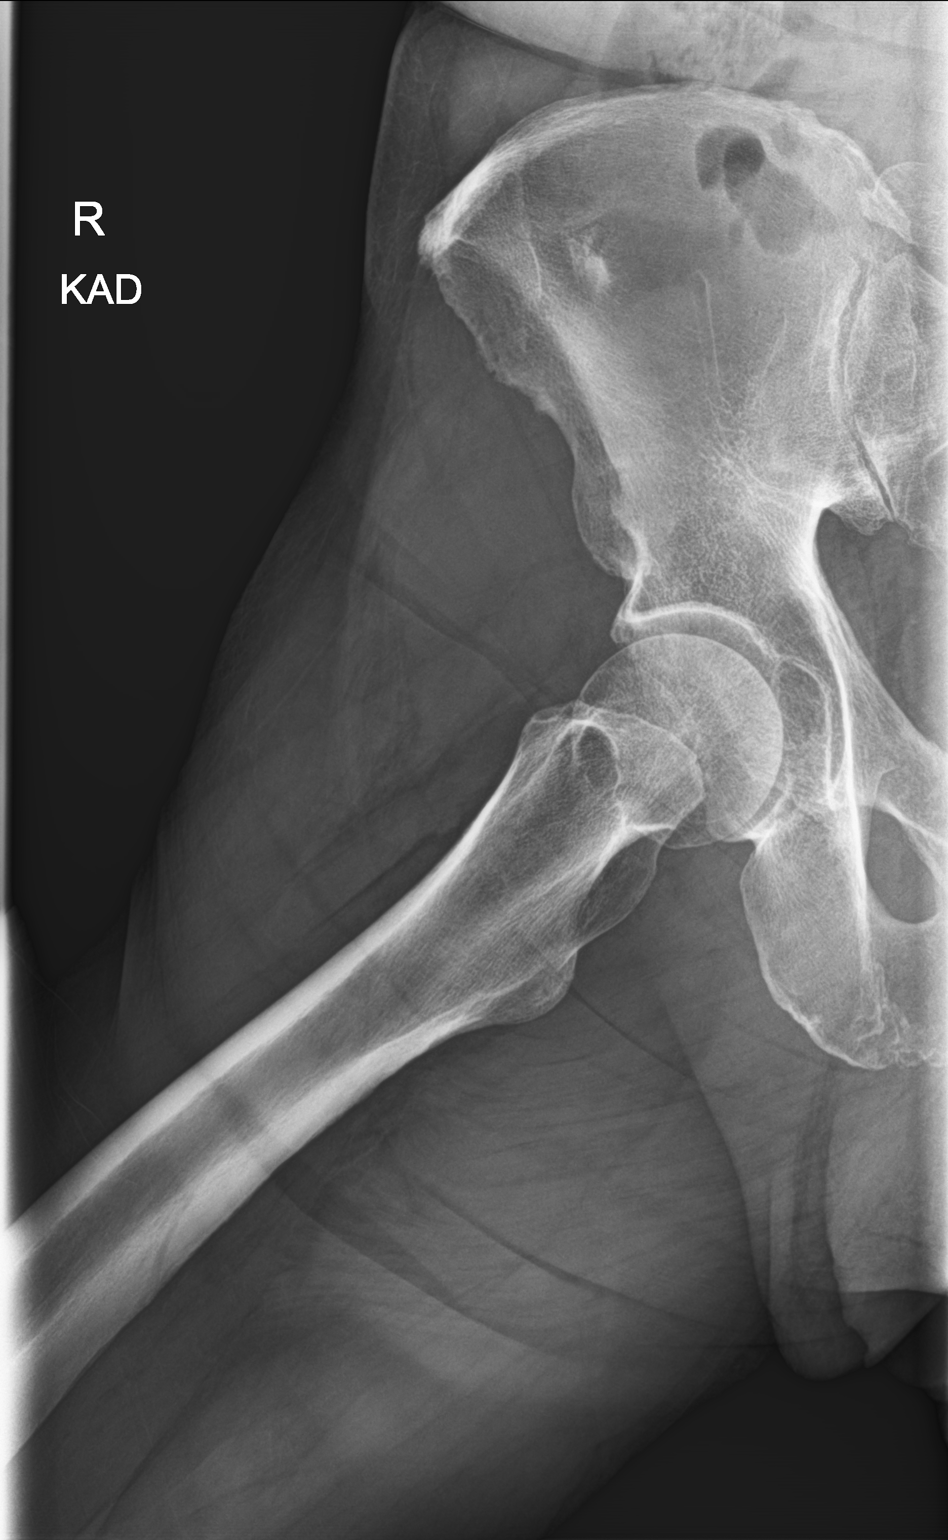

[3 of 3 positions shown; findings below may reference images not displayed]

FINDINGS: Osseous mineralization normal for technique.

Hip and SI joint spaces symmetric and preserved.

No acute fracture, dislocation, or bone destruction.

Question bone island in the RIGHT ilium.
IMPRESSION: No acute osseous abnormalities.

## 2017-11-30 ENCOUNTER — Ambulatory Visit (INDEPENDENT_AMBULATORY_CARE_PROVIDER_SITE_OTHER): Payer: BC Managed Care – PPO | Admitting: Internal Medicine

## 2017-11-30 ENCOUNTER — Encounter: Payer: Self-pay | Admitting: Internal Medicine

## 2017-11-30 VITALS — BP 122/74 | HR 79 | Temp 97.6°F | Ht 69.75 in | Wt 207.5 lb

## 2017-11-30 DIAGNOSIS — E785 Hyperlipidemia, unspecified: Secondary | ICD-10-CM | POA: Diagnosis not present

## 2017-11-30 DIAGNOSIS — Z0000 Encounter for general adult medical examination without abnormal findings: Secondary | ICD-10-CM | POA: Diagnosis not present

## 2017-11-30 DIAGNOSIS — M353 Polymyalgia rheumatica: Secondary | ICD-10-CM | POA: Diagnosis not present

## 2017-11-30 LAB — CBC
HEMATOCRIT: 47.9 % (ref 39.0–52.0)
HEMOGLOBIN: 16.1 g/dL (ref 13.0–17.0)
MCHC: 33.5 g/dL (ref 30.0–36.0)
MCV: 93.3 fl (ref 78.0–100.0)
PLATELETS: 228 10*3/uL (ref 150.0–400.0)
RBC: 5.13 Mil/uL (ref 4.22–5.81)
RDW: 13 % (ref 11.5–15.5)
WBC: 5.9 10*3/uL (ref 4.0–10.5)

## 2017-11-30 LAB — COMPREHENSIVE METABOLIC PANEL
ALT: 31 U/L (ref 0–53)
AST: 20 U/L (ref 0–37)
Albumin: 4.1 g/dL (ref 3.5–5.2)
Alkaline Phosphatase: 41 U/L (ref 39–117)
BUN: 21 mg/dL (ref 6–23)
CHLORIDE: 104 meq/L (ref 96–112)
CO2: 29 meq/L (ref 19–32)
CREATININE: 0.93 mg/dL (ref 0.40–1.50)
Calcium: 9.3 mg/dL (ref 8.4–10.5)
GFR: 86.98 mL/min (ref >60.00)
Glucose, Bld: 94 mg/dL (ref 70–99)
POTASSIUM: 3.8 meq/L (ref 3.5–5.1)
Sodium: 138 mEq/L (ref 135–145)
Total Bilirubin: 0.8 mg/dL (ref 0.2–1.2)
Total Protein: 7.1 g/dL (ref 6.0–8.3)

## 2017-11-30 LAB — SEDIMENTATION RATE: Sed Rate: 8 mm/hr (ref 0–20)

## 2017-11-30 MED ORDER — PREDNISONE 5 MG PO TABS: 5.0000 mg | ORAL_TABLET | Freq: Every day | ORAL | 0 refills | Status: DC

## 2017-11-30 MED ORDER — TERBINAFINE HCL 250 MG PO TABS: 250.0000 mg | ORAL_TABLET | Freq: Every day | ORAL | 0 refills | Status: DC

## 2017-11-30 NOTE — Assessment & Plan Note (Signed)
Healthy Discussed fitness Will consider shingrix Colon due 2022 Defer PSA---repeat was normal last year Yearly flu vaccine

## 2017-11-30 NOTE — Assessment & Plan Note (Signed)
Remission on low dose prednisone--hasn't been able to stop

## 2017-11-30 NOTE — Progress Notes (Signed)
Subjective:    Patient ID: Alexander Cohen, male    DOB: 1956-02-08, 62 y.o.   MRN: [id number]  HPI Here for physical  Did have one sinus infection this year Cleared with antibiotics Retired last June--working part time  Has cut the prednisone to 5mg  every day When he cut it back to 5/2.5---he feels it  Ongoing knee pain Still able to walk a lot and exercise Some ankle swelling at times---during the day. No pain  Current Outpatient Medications on File Prior to Visit  Medication Sig Dispense Refill  . EPIPEN 2-PAK 0.3 MG/0.3ML SOAJ injection INJECT 1 PEN SUBCUTANEOUSLY AS DIRECTED (Patient not taking: Reported on 09/29/2017) 1 Device 1  . predniSONE (DELTASONE) 5 MG tablet 1 by mouth daily for 1-2 months. Then 5mg  alternating with 2.5mg  for a month. Then 5mg  every other day 100 tablet 3   No current facility-administered medications on file prior to visit.     No Known Allergies  Past Medical History:  Diagnosis Date  . ADHD (attention deficit hyperactivity disorder), inattentive type   . Anxiety   . Hyperlipidemia   . Nasal polyp   . Osteoarthritis     Past Surgical History:  Procedure Laterality Date  . INGUINAL HERNIA REPAIR  09/1999   (Dr. Quillian Quince)  . KNEE ARTHROSCOPY  1974   Right  . KNEE CARTILAGE SURGERY  1973   Left    Family History  Problem Relation Age of Onset  . Alcohol abuse Mother   . Alcohol abuse Father   . Diabetes Maternal Grandmother   . Colon cancer Neg Hx     Social History   Socioeconomic History  . Marital status: Married    Spouse name: Not on file  . Number of children: 3  . Years of education: Not on file  . Highest education level: Not on file  Social Needs  . Financial resource strain: Not on file  . Food insecurity - worry: Not on file  . Food insecurity - inability: Not on file  . Transportation needs - medical: Not on file  . Transportation needs - non-medical: Not on file  Occupational History  . 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 Smoking status: Never Smoker  . Smokeless tobacco: Never Used  Substance and Sexual Activity  . Alcohol use: Yes    Alcohol/week: 2.4 oz    Types: 4 Glasses of wine per week  . Drug use: No  . Sexual activity: Not on file  Other Topics Concern  . Not on file  Social History Narrative  . Not on file   Review of Systems  Constitutional: Negative for fatigue and unexpected weight change.       Wears seat belt  HENT: Negative for dental problem, hearing loss and tinnitus.        Keeps up with dentist  Eyes: Negative for visual disturbance.       No diplopia or unilateral vision loss  Respiratory: Negative for cough, chest tightness and shortness of breath.   Cardiovascular: Positive for leg swelling. Negative for chest pain and palpitations.  Gastrointestinal: Negative for abdominal pain, blood in stool and constipation.       No heartburn  Endocrine: Negative for polydipsia and polyuria.  Genitourinary: Negative for difficulty urinating and urgency.       Some increased nocturia No sexual problems  Musculoskeletal: Positive  for arthralgias and myalgias. Negative for back pain.  Skin: Negative for rash.       Toenail fungus returning--wants to restart the lamisil  Allergic/Immunologic: Positive for environmental allergies. Negative for immunocompromised state.       Uses loratadine and nasonex  Neurological: Negative for dizziness, syncope, light-headedness and headaches.  Hematological: Negative for adenopathy. Does not bruise/bleed easily.  Psychiatric/Behavioral: Positive for sleep disturbance.       Sleeps 5.5-6 hours only. No daytime somnolence       Objective:   Physical Exam  Constitutional: He is oriented to person, place, and time. He appears well-developed. No distress.  HENT:  Head: Normocephalic and atraumatic.  Right Ear: External ear  normal.  Left Ear: External ear normal.  Mouth/Throat: Oropharynx is clear and moist. No oropharyngeal exudate.  Eyes: Conjunctivae are normal. Pupils are equal, round, and reactive to light.  Neck: No thyromegaly present.  Cardiovascular: Normal rate, regular rhythm, normal heart sounds and intact distal pulses. Exam reveals no gallop.  No murmur heard. Pulmonary/Chest: Effort normal and breath sounds normal. No respiratory distress. He has no wheezes. He has no rales.  Abdominal: Soft. There is no tenderness.  Musculoskeletal: He exhibits no edema or tenderness.  Lymphadenopathy:    He has no cervical adenopathy.  Neurological: He is alert and oriented to person, place, and time.  Skin: No rash noted. No erythema.  7/10 mycotic toenails  Psychiatric: He has a normal mood and affect. His behavior is normal.          Assessment & Plan:

## 2017-11-30 NOTE — Assessment & Plan Note (Signed)
Discussed primary prevention--doesn't want statin

## 2017-11-30 NOTE — Patient Instructions (Addendum)
You can check into the shingrix vaccine---if your insurance covers it, I would recommend getting it now (before going on Medicare) If you do okay with the prednisone 10mg  every other day, instead of 5mg  daily --that would be preferable. (but not a big deal)

## 2018-01-10 ENCOUNTER — Encounter: Payer: Self-pay | Admitting: Internal Medicine

## 2018-01-10 ENCOUNTER — Ambulatory Visit: Payer: BC Managed Care – PPO | Admitting: Internal Medicine

## 2018-01-10 VITALS — BP 132/86 | HR 84 | Temp 98.2°F | Resp 18 | Wt 214.0 lb

## 2018-01-10 DIAGNOSIS — S86912A Strain of unspecified muscle(s) and tendon(s) at lower leg level, left leg, initial encounter: Secondary | ICD-10-CM | POA: Diagnosis not present

## 2018-01-10 DIAGNOSIS — J0110 Acute frontal sinusitis, unspecified: Secondary | ICD-10-CM | POA: Diagnosis not present

## 2018-01-10 MED ORDER — AMOXICILLIN-POT CLAVULANATE 875-125 MG PO TABS: 1.0000 | ORAL_TABLET | Freq: Two times a day (BID) | ORAL | 0 refills | Status: AC

## 2018-01-10 NOTE — Progress Notes (Signed)
Subjective:    Patient ID: Alexander Cohen, male    DOB: 1956/10/01, 62 y.o.   MRN: [id number]  HPI Here due to respiratory illness  Having the bad sinus and ear pressure Lots of post nasal drip Frontal headache Throat is irritated Now in 2nd day and really worsening Very fatigued Went to work but had to leave due to bad cough  Alka seltzer and hall's cough drops  No fever but felt clammy Slight SOB His classic sinus symptoms  Using motrin for his knee--- left one is bothering him more Swollen in past couple of weeks--limiting his gym time Knows he needs TKR eventually---twisted it when working with a trainer  Current Outpatient Medications on File Prior to Visit  Medication Sig Dispense Refill  . EPIPEN 2-PAK 0.3 MG/0.3ML SOAJ injection INJECT 1 PEN SUBCUTANEOUSLY AS DIRECTED 1 Device 1  . predniSONE (DELTASONE) 5 MG tablet Take 1 tablet (5 mg total) by mouth daily. 1 tablet 0  . terbinafine (LAMISIL) 250 MG tablet Take 1 tablet (250 mg total) by mouth daily. 90 tablet 0   No current facility-administered medications on file prior to visit.     No Known Allergies  Past Medical History:  Diagnosis Date  . ADHD (attention deficit hyperactivity disorder), inattentive type   . Anxiety   . Hyperlipidemia   . Nasal polyp   . Osteoarthritis     Past Surgical History:  Procedure Laterality Date  . INGUINAL HERNIA REPAIR  09/1999   (Dr. Quillian Quince)  . KNEE ARTHROSCOPY  1974   Right  . KNEE CARTILAGE SURGERY  1973   Left    Family History  Problem Relation Age of Onset  . Alcohol abuse Mother   . Alcohol abuse Father   . Diabetes Maternal Grandmother   . Colon cancer Neg Hx     Social History   Socioeconomic History  . Marital status: Married    Spouse name: Not on file  . Number of children: 3  . Years of education: Not on file  . Highest education level: Not on file  Social Needs  . Financial resource strain: Not on file  . Food insecurity - worry:  Not on file  . Food insecurity - inability: Not on file  . Transportation needs - medical: Not on file  . Transportation needs - non-medical: Not on file  Occupational History  . 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 Smoking status: Never Smoker  . Smokeless tobacco: Never Used  Substance and Sexual Activity  . Alcohol use: Yes    Alcohol/week: 2.4 oz    Types: 4 Glasses of wine per week  . Drug use: No  . Sexual activity: Not on file  Other Topics Concern  . Not on file  Social History Narrative  . Not on file   Review of Systems No rash  No vomiting or diarrhea Appetite is off    Objective:   Physical Exam  Constitutional: He appears well-developed. No distress.  HENT:  Moderate frontal tenderness Moderate nasal inflammation TMs normal Slight pharyngeal injection  Neck: No thyromegaly present.  Pulmonary/Chest: Effort normal and breath sounds normal. No respiratory distress. He has no wheezes. He has no rales.  Musculoskeletal:  Left knee with bony thickening but no clear effusion No clear meniscus findings No ligament instability Able to bear weight fully  Lymphadenopathy:  He has no cervical adenopathy.          Assessment & Plan:

## 2018-01-10 NOTE — Patient Instructions (Signed)
Please start the antibiotic if you are worsening in the next few days. 

## 2018-01-10 NOTE — Assessment & Plan Note (Signed)
Has end stage arthritic changes but not ready for TKR No signs of other surgical damage with current injury Counseled on management

## 2018-01-10 NOTE — Assessment & Plan Note (Signed)
Prone to sinus infections May still just be viral Discussed analgesics and flonase Antibiotic if worsens

## 2018-07-27 ENCOUNTER — Other Ambulatory Visit: Payer: Self-pay

## 2018-07-27 MED ORDER — PREDNISONE 5 MG PO TABS: 5.0000 mg | ORAL_TABLET | Freq: Every day | ORAL | 3 refills | Status: DC

## 2018-07-27 NOTE — Telephone Encounter (Signed)
Copied from [place] [id number]. Topic: Quick Communication - Rx Refill/Question >> Jul 27, 2018 12:44 PM Carolyn Stare wrote: Medication  predniSONE (Carefree) 5 MG tablet  Has the patient contacted their pharmacy yes   ( Preferred Pharmacy  [place]   Agent: Please be advised that RX refills may take up to 3 business days. We ask that you follow-up with your pharmacy.

## 2018-08-25 MED ORDER — PREDNISONE 5 MG PO TABS: 5.0000 mg | ORAL_TABLET | Freq: Every day | ORAL | 3 refills | Status: DC

## 2018-08-25 NOTE — Telephone Encounter (Signed)
Patient would like his Prednisone prescription filled and sent to the [facility] in the [facility]. Patient has 4 days left of his medication.

## 2018-08-25 NOTE — Telephone Encounter (Signed)
On med list prednisone 5 mg # 90 x 3 on 07/27/18 but order class is fill later. Do you want to send electronically? Pt last seen 11/30/17 PMR and has CPX scheduled 12/06/18.Please advise.

## 2018-08-25 NOTE — Telephone Encounter (Signed)
Pt requesting refill please review

## 2018-08-25 NOTE — Addendum Note (Signed)
Addended by: Helene Shoe on: 08/25/2018 04:19 PM   Modules accepted: Orders

## 2018-08-25 NOTE — Addendum Note (Signed)
Addended by: Viviana Simpler I on: 08/25/2018 05:16 PM   Modules accepted: Orders

## 2018-12-06 ENCOUNTER — Ambulatory Visit (INDEPENDENT_AMBULATORY_CARE_PROVIDER_SITE_OTHER): Payer: BC Managed Care – PPO | Admitting: Internal Medicine

## 2018-12-06 ENCOUNTER — Encounter: Payer: Self-pay | Admitting: Internal Medicine

## 2018-12-06 VITALS — BP 118/82 | HR 76 | Temp 97.7°F | Ht 70.0 in | Wt 206.0 lb

## 2018-12-06 DIAGNOSIS — M353 Polymyalgia rheumatica: Secondary | ICD-10-CM | POA: Diagnosis not present

## 2018-12-06 DIAGNOSIS — E785 Hyperlipidemia, unspecified: Secondary | ICD-10-CM

## 2018-12-06 DIAGNOSIS — Z0000 Encounter for general adult medical examination without abnormal findings: Secondary | ICD-10-CM

## 2018-12-06 LAB — CBC
HEMATOCRIT: 47 % (ref 39.0–52.0)
HEMOGLOBIN: 15.7 g/dL (ref 13.0–17.0)
MCHC: 33.3 g/dL (ref 30.0–36.0)
MCV: 92.8 fl (ref 78.0–100.0)
Platelets: 226 10*3/uL (ref 150.0–400.0)
RBC: 5.07 Mil/uL (ref 4.22–5.81)
RDW: 13.2 % (ref 11.5–15.5)
WBC: 5.4 10*3/uL (ref 4.0–10.5)

## 2018-12-06 LAB — COMPREHENSIVE METABOLIC PANEL
ALT: 30 U/L (ref 0–53)
AST: 19 U/L (ref 0–37)
Albumin: 3.9 g/dL (ref 3.5–5.2)
Alkaline Phosphatase: 45 U/L (ref 39–117)
BILIRUBIN TOTAL: 0.7 mg/dL (ref 0.2–1.2)
BUN: 16 mg/dL (ref 6–23)
CHLORIDE: 106 meq/L (ref 96–112)
CO2: 29 meq/L (ref 19–32)
CREATININE: 0.92 mg/dL (ref 0.40–1.50)
Calcium: 9.4 mg/dL (ref 8.4–10.5)
GFR: 82.6 mL/min (ref >60.00)
GLUCOSE: 98 mg/dL (ref 70–99)
Potassium: 4 mEq/L (ref 3.5–5.1)
Sodium: 140 mEq/L (ref 135–145)
Total Protein: 6.6 g/dL (ref 6.0–8.3)

## 2018-12-06 LAB — SEDIMENTATION RATE: Sed Rate: 10 mm/hr (ref 0–20)

## 2018-12-06 MED ORDER — MUPIROCIN 2 % EX OINT: 1.0000 "application " | TOPICAL_OINTMENT | Freq: Three times a day (TID) | CUTANEOUS | 2 refills | Status: DC

## 2018-12-06 NOTE — Progress Notes (Signed)
Subjective:    Patient ID: Alexander Cohen, male    DOB: 1956/02/21, 63 y.o.   MRN: [id number]  HPI Here for physical  Has another sore under his nose "came out of nowhere" No history of cold sores Started about a week ago  Still on prednisone daily Again has tried to cut it back slowly---but can tell the difference even at 5/2.5  Still works out regularly Consults part time still  Current Outpatient Medications on File Prior to Visit  Medication Sig Dispense Refill  . EPIPEN 2-PAK 0.3 MG/0.3ML SOAJ injection INJECT 1 PEN SUBCUTANEOUSLY AS DIRECTED 1 Device 1  . predniSONE (DELTASONE) 5 MG tablet Take 5 mg by mouth daily with breakfast.     No current facility-administered medications on file prior to visit.     Allergies  Allergen Reactions  . Other Anaphylaxis    Pine Nuts    Past Medical History:  Diagnosis Date  . ADHD (attention deficit hyperactivity disorder), inattentive type   . Anxiety   . Hyperlipidemia   . Nasal polyp   . Osteoarthritis     Past Surgical History:  Procedure Laterality Date  . INGUINAL HERNIA REPAIR  09/1999   (Dr. Quillian Quince)  . KNEE ARTHROSCOPY  1974   Right  . KNEE CARTILAGE SURGERY  1973   Left    Family History  Problem Relation Age of Onset  . Alcohol abuse Mother   . Alcohol abuse Father   . Diabetes Maternal Grandmother   . Colon cancer Neg Hx     Social History   Socioeconomic History  . Marital status: Married    Spouse name: Not on file  . Number of children: 3  . Years of education: Not on file  . Highest education level: Not on file  Occupational History  . 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 Financial resource strain: Not on file  . Food insecurity:    Worry: Not on file    Inability: Not on file  . Transportation needs:    Medical: Not on file    Non-medical: Not on file  Tobacco Use  . Smoking  status: Never Smoker  . Smokeless tobacco: Never Used  Substance and Sexual Activity  . Alcohol use: Yes    Alcohol/week: 4.0 standard drinks    Types: 4 Glasses of wine per week  . Drug use: No  . Sexual activity: Not on file  Lifestyle  . Physical activity:    Days per week: Not on file    Minutes per session: Not on file  . Stress: Not on file  Relationships  . Social connections:    Talks on phone: Not on file    Gets together: Not on file    Attends religious service: Not on file    Active member of club or organization: Not on file    Attends meetings of clubs or organizations: Not on file    Relationship status: Not on file  . Intimate partner violence:    Fear of current or ex partner: Not on file    Emotionally abused: Not on file    Physically abused: Not on file    Forced sexual activity: Not on file  Other Topics Concern  . Not on file  Social History Narrative  . Not on file   Review of Systems  Constitutional: Negative for fatigue  and unexpected weight change.       Wears seat belt  HENT: Positive for hearing loss. Negative for dental problem and tinnitus.        Keeps up with dentist  Eyes: Negative for visual disturbance.       No diplopia or unilateral vision loss  Respiratory: Negative for cough, chest tightness and shortness of breath.   Cardiovascular: Negative for chest pain, palpitations and leg swelling.  Gastrointestinal: Negative for abdominal pain, blood in stool and constipation.       No heartburn  Endocrine: Negative for polydipsia and polyuria.  Genitourinary: Negative for dysuria and urgency.       Rare dribbling No sexual problems  Musculoskeletal: Positive for arthralgias. Negative for back pain and joint swelling.       Some knee pain--uses sleeves on it (and occasional advil)  Skin: Negative for rash.       No suspicious skin lesions  Allergic/Immunologic: Positive for environmental allergies. Negative for immunocompromised state.         Uses flonase in season  Neurological: Negative for dizziness, syncope, light-headedness and headaches.  Hematological: Negative for adenopathy. Bruises/bleeds easily.  Psychiatric/Behavioral: Negative for dysphoric mood and sleep disturbance. The patient is not nervous/anxious.        Mild sleep issues--- no meds       Objective:   Physical Exam  Constitutional: He is oriented to person, place, and time. He appears well-developed. No distress.  HENT:  Head: Normocephalic and atraumatic.  Right Ear: External ear normal.  Left Ear: External ear normal.  Mouth/Throat: Oropharynx is clear and moist. No oropharyngeal exudate.  Eyes: Pupils are equal, round, and reactive to light. Conjunctivae are normal.  Neck: No thyromegaly present.  Cardiovascular: Normal rate, regular rhythm, normal heart sounds and intact distal pulses. Exam reveals no gallop.  No murmur heard. Respiratory: Effort normal and breath sounds normal. No respiratory distress. He has no wheezes. He has no rales.  GI: Soft. There is no abdominal tenderness.  Musculoskeletal:        General: No tenderness or edema.  Lymphadenopathy:    He has no cervical adenopathy.  Neurological: He is alert and oriented to person, place, and time.  Skin: No rash noted. No erythema.  Small ulcerated area under left nare--not really inflamed  Psychiatric: He has a normal mood and affect. His behavior is normal.           Assessment & Plan:

## 2018-12-06 NOTE — Assessment & Plan Note (Signed)
No Rx after discussion High HDL

## 2018-12-06 NOTE — Assessment & Plan Note (Signed)
Has not tolerated wean from 5mg  daily Asked him to consider trying 10 every other day

## 2018-12-06 NOTE — Assessment & Plan Note (Signed)
Healthy Stays fit Watching weight Yearly flu vaccine Will get shingrix at pharmacy Pneumonia vaccines start next time Colon due 2022 Will defer PSA to next year

## 2019-08-10 ENCOUNTER — Encounter: Payer: Self-pay | Admitting: Gastroenterology

## 2019-08-21 ENCOUNTER — Other Ambulatory Visit: Payer: Self-pay | Admitting: Internal Medicine

## 2019-08-21 NOTE — Telephone Encounter (Signed)
He has an appt in December but his last physical was in February. Unless he has changed to Medicare and this is initial visit, we should reschedule him to 1 year after last PE

## 2019-09-08 ENCOUNTER — Other Ambulatory Visit: Payer: Self-pay

## 2019-09-08 DIAGNOSIS — Z20822 Contact with and (suspected) exposure to covid-19: Secondary | ICD-10-CM

## 2019-09-09 LAB — NOVEL CORONAVIRUS, NAA: SARS-CoV-2, NAA: NOT DETECTED

## 2019-09-12 ENCOUNTER — Other Ambulatory Visit: Payer: Self-pay

## 2019-09-12 DIAGNOSIS — Z20822 Contact with and (suspected) exposure to covid-19: Secondary | ICD-10-CM

## 2019-09-14 LAB — NOVEL CORONAVIRUS, NAA: SARS-CoV-2, NAA: NOT DETECTED

## 2019-10-04 ENCOUNTER — Encounter: Payer: BC Managed Care – PPO | Admitting: Internal Medicine

## 2019-12-15 ENCOUNTER — Ambulatory Visit (INDEPENDENT_AMBULATORY_CARE_PROVIDER_SITE_OTHER): Payer: Medicare PPO | Admitting: Internal Medicine

## 2019-12-15 ENCOUNTER — Encounter: Payer: Self-pay | Admitting: Internal Medicine

## 2019-12-15 ENCOUNTER — Other Ambulatory Visit: Payer: Self-pay

## 2019-12-15 VITALS — BP 114/84 | HR 80 | Temp 97.8°F | Ht 70.0 in | Wt 207.0 lb

## 2019-12-15 DIAGNOSIS — Z0000 Encounter for general adult medical examination without abnormal findings: Secondary | ICD-10-CM

## 2019-12-15 DIAGNOSIS — E785 Hyperlipidemia, unspecified: Secondary | ICD-10-CM

## 2019-12-15 DIAGNOSIS — M353 Polymyalgia rheumatica: Secondary | ICD-10-CM

## 2019-12-15 DIAGNOSIS — H9193 Unspecified hearing loss, bilateral: Secondary | ICD-10-CM | POA: Diagnosis not present

## 2019-12-15 DIAGNOSIS — Z125 Encounter for screening for malignant neoplasm of prostate: Secondary | ICD-10-CM | POA: Diagnosis not present

## 2019-12-15 DIAGNOSIS — Z7189 Other specified counseling: Secondary | ICD-10-CM

## 2019-12-15 DIAGNOSIS — M130 Polyarthritis, unspecified: Secondary | ICD-10-CM | POA: Diagnosis not present

## 2019-12-15 DIAGNOSIS — H9190 Unspecified hearing loss, unspecified ear: Secondary | ICD-10-CM | POA: Insufficient documentation

## 2019-12-15 LAB — CBC
HCT: 50.2 % (ref 39.0–52.0)
Hemoglobin: 16.7 g/dL (ref 13.0–17.0)
MCHC: 33.2 g/dL (ref 30.0–36.0)
MCV: 94.2 fl (ref 78.0–100.0)
Platelets: 212 10*3/uL (ref 150.0–400.0)
RBC: 5.33 Mil/uL (ref 4.22–5.81)
RDW: 13 % (ref 11.5–15.5)
WBC: 5.2 10*3/uL (ref 4.0–10.5)

## 2019-12-15 LAB — COMPREHENSIVE METABOLIC PANEL
ALT: 42 U/L (ref 0–53)
AST: 27 U/L (ref 0–37)
Albumin: 4.1 g/dL (ref 3.5–5.2)
Alkaline Phosphatase: 50 U/L (ref 39–117)
BUN: 15 mg/dL (ref 6–23)
CO2: 31 mEq/L (ref 19–32)
Calcium: 9.4 mg/dL (ref 8.4–10.5)
Chloride: 105 mEq/L (ref 96–112)
Creatinine, Ser: 1 mg/dL (ref 0.40–1.50)
GFR: 74.79 mL/min (ref >60.00)
Glucose, Bld: 98 mg/dL (ref 70–99)
Potassium: 4.7 mEq/L (ref 3.5–5.1)
Sodium: 140 mEq/L (ref 135–145)
Total Bilirubin: 0.4 mg/dL (ref 0.2–1.2)
Total Protein: 6.8 g/dL (ref 6.0–8.3)

## 2019-12-15 LAB — PSA, MEDICARE: PSA: 3.84 ng/ml (ref 0.10–4.00)

## 2019-12-15 LAB — LIPID PANEL
Cholesterol: 244 mg/dL — ABNORMAL HIGH (ref 0–200)
HDL: 71 mg/dL (ref >39.00)
LDL Cholesterol: 149 mg/dL — ABNORMAL HIGH (ref 0–99)
NonHDL: 172.98
Total CHOL/HDL Ratio: 3
Triglycerides: 118 mg/dL (ref 0.0–149.0)
VLDL: 23.6 mg/dL (ref 0.0–40.0)

## 2019-12-15 LAB — SEDIMENTATION RATE: Sed Rate: 8 mm/hr (ref 0–20)

## 2019-12-15 LAB — T4, FREE: Free T4: 0.78 ng/dL (ref 0.60–1.60)

## 2019-12-15 NOTE — Progress Notes (Signed)
Hearing Screening   Method: Audiometry   125Hz  250Hz  500Hz  1000Hz  2000Hz  3000Hz  4000Hz  6000Hz  8000Hz   Right ear:   20 20 20   0    Left ear:   20 20 20   0      Visual Acuity Screening   Right eye Left eye Both eyes  Without correction:     With correction: 20/20 20/25 20/15

## 2019-12-15 NOTE — Assessment & Plan Note (Addendum)
I have personally reviewed the Medicare Annual Wellness questionnaire and have noted 1. The patient's medical and social history 2. Their use of alcohol, tobacco or illicit drugs 3. Their current medications and supplements 4. The patient's functional ability including ADL's, fall risks, home safety risks and hearing or visual             impairment. 5. Diet and physical activities 6. Evidence for depression or mood disorders  The patients weight, height, BMI and visual acuity have been recorded in the chart I have made referrals, counseling and provided education to the patient based review of the above and I have provided the pt with a written personalized care plan for preventive services.  I have provided you with a copy of your personalized plan for preventive services. Please take the time to review along with your updated medication list.  Annual flu vaccine Had COVID vaccines---last 2 days ago Will start pneumonia vaccines next year Colon is due in 2022 Discussed PSA --will check Had first shingrix

## 2019-12-15 NOTE — Assessment & Plan Note (Signed)
Discussed Still not interested in statin

## 2019-12-15 NOTE — Assessment & Plan Note (Signed)
See social history 

## 2019-12-15 NOTE — Assessment & Plan Note (Signed)
In remission on low dose prednisone Will check ESR

## 2019-12-15 NOTE — Progress Notes (Signed)
Subjective:    Patient ID: Alexander Cohen, male    DOB: 1956-02-27, 64 y.o.   MRN: [id number]  HPI Here for Welcome to Medicare wellness visit and follow up of chronic health conditions This visit occurred during the SARS-CoV-2 public health emergency.  Safety protocols were in place, including screening questions prior to the visit, additional usage of staff PPE, and extensive cleaning of exam room while observing appropriate contact time as indicated for disinfecting solutions.   Reviewed form and advanced directives Reviewed other doctors No tobacco Regular glass of wine nightly and rare bourbon Fell off ladder to attic---lost balance. 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  Has had increased trouble with his hearing Affecting his function---masks have made it worse Discussed getting tested and hearing aides if appropriate  Somewhat erratic sleep---back in full time work mode Not sleeping soundly---has tried melatonin or benedryl They do help some No apnea---only occasional snoring  Taking 5mg  prednisone daily still for the PMR Has not tolerated wean from this dose  Ongoing hand arthritis Can be hard to open a jar Wrists will hurt if trying to push himself up on them No swelling No meds  Reviewed high cholesterol  Still not interested in medications for this High HDL is protective  Current Outpatient Medications on File Prior to Visit  Medication Sig Dispense Refill  . EPIPEN 2-PAK 0.3 MG/0.3ML SOAJ injection INJECT 1 PEN SUBCUTANEOUSLY AS DIRECTED 1 Device 1  . predniSONE (DELTASONE) 5 MG tablet TAKE ONE TABLET BY MOUTH DAILY 90 tablet 2   No current facility-administered medications on file prior to visit.    Allergies  Allergen  Reactions  . Other Anaphylaxis    Pine Nuts    Past Medical History:  Diagnosis Date  . ADHD (attention deficit hyperactivity disorder), inattentive type   . Anxiety   . Hyperlipidemia   . Nasal polyp   . Osteoarthritis     Past Surgical History:  Procedure Laterality Date  . INGUINAL HERNIA REPAIR  09/1999   (Dr. Quillian Quince)  . KNEE ARTHROSCOPY  1974   Right  . KNEE CARTILAGE SURGERY  1973   Left    Family History  Problem Relation Age of Onset  . Alcohol abuse Mother   . Alcohol abuse Father   . Diabetes Maternal Grandmother   . Colon cancer Neg Hx     Social History   Socioeconomic History  . Marital status: Married    Spouse name: Not on file  . Number of children: 3  . Years of education: Not on file  . Highest education level: Not on file  Occupational History  . 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 Occupation: Principal     Comment: [place] for 1 year  Tobacco Use  . Smoking status: Never Smoker  . Smokeless tobacco: Never Used  Substance and Sexual Activity  . Alcohol use: Yes    Alcohol/week: 4.0 standard drinks    Types: 4 Glasses of wine per week  . Drug use: No  . Sexual activity: Not on file  Other Topics Concern  . Not on file  Social History Narrative   Has living will   Wife, then  son Jenny Reichmann, is health care POA   Would accept resuscitation   No tube feeds if cognitively unaware   Social Determinants of Health   Financial Resource Strain:   . Difficulty of Paying Living Expenses: Not on file  Food Insecurity:   . Worried About Charity fundraiser in the Last Year: Not on file  . Ran Out of Food in the Last Year: Not on file  Transportation Needs:   . Lack of Transportation (Medical): Not on file  . Lack of Transportation (Non-Medical): Not on file  Physical Activity:   . Days of Exercise per Week: Not on file  . Minutes of Exercise per  Session: Not on file  Stress:   . Feeling of Stress : Not on file  Social Connections:   . Frequency of Communication with Friends and Family: Not on file  . Frequency of Social Gatherings with Friends and Family: Not on file  . Attends Religious Services: Not on file  . Active Member of Clubs or Organizations: Not on file  . Attends Archivist Meetings: Not on file  . Marital Status: Not on file  Intimate Partner Violence:   . Fear of Current or Ex-Partner: Not on file  . Emotionally Abused: Not on file  . Physically Abused: Not on file  . Sexually Abused: Not on file   Review of Systems Appetite is good Weight stable Wears seat belt Teeth are fine---keeps up with dentist No suspicious skin lesions No chest pain or SOB No dizziness or syncope No edema Occasional heartburn--tums helps. No dysphagia Bowels are fine--no blood Voids fine. Nocturia is 1-2. Stream is okay    Objective:   Physical Exam  Constitutional: He is oriented to person, place, and time. He appears well-developed. No distress.  HENT:  Mouth/Throat: Oropharynx is clear and moist. No oropharyngeal exudate.  Neck: No thyromegaly present.  Cardiovascular: Normal rate, regular rhythm, normal heart sounds and intact distal pulses. Exam reveals no gallop.  No murmur heard. Respiratory: Effort normal and breath sounds normal. No respiratory distress. He has no wheezes. He has no rales.  GI: Soft. There is no abdominal tenderness.  Musculoskeletal:        General: No tenderness or edema.     Comments: No hand or wrist synovitis  Lymphadenopathy:    He has no cervical adenopathy.  Neurological: He is alert and oriented to person, place, and time.  President--- "Jonna Munro, Daisy Floro, Obama" [phone] D-l-r-o-w Recall 3/3  Skin: No rash noted. No erythema.  Psychiatric: He has a normal mood and affect. His behavior is normal.           Assessment & Plan:

## 2019-12-15 NOTE — Assessment & Plan Note (Signed)
In hands/wrists only No evidence of inflammatory synovitis

## 2019-12-15 NOTE — Assessment & Plan Note (Signed)
Recommended formal audiology evaluation

## 2020-05-24 ENCOUNTER — Other Ambulatory Visit: Payer: Self-pay | Admitting: Internal Medicine

## 2020-09-16 DIAGNOSIS — Z20822 Contact with and (suspected) exposure to covid-19: Secondary | ICD-10-CM | POA: Diagnosis not present

## 2020-11-02 LAB — HM HEPATITIS C SCREENING LAB: HM Hepatitis Screen: NEGATIVE

## 2020-11-21 ENCOUNTER — Other Ambulatory Visit: Payer: Self-pay | Admitting: Internal Medicine

## 2020-12-17 ENCOUNTER — Ambulatory Visit (INDEPENDENT_AMBULATORY_CARE_PROVIDER_SITE_OTHER): Payer: Medicare PPO | Admitting: Internal Medicine

## 2020-12-17 ENCOUNTER — Other Ambulatory Visit: Payer: Self-pay

## 2020-12-17 ENCOUNTER — Encounter: Payer: Self-pay | Admitting: Internal Medicine

## 2020-12-17 VITALS — BP 120/82 | HR 76 | Temp 97.4°F | Ht 70.0 in | Wt 207.0 lb

## 2020-12-17 DIAGNOSIS — E785 Hyperlipidemia, unspecified: Secondary | ICD-10-CM

## 2020-12-17 DIAGNOSIS — H9193 Unspecified hearing loss, bilateral: Secondary | ICD-10-CM

## 2020-12-17 DIAGNOSIS — Z23 Encounter for immunization: Secondary | ICD-10-CM

## 2020-12-17 DIAGNOSIS — Z0000 Encounter for general adult medical examination without abnormal findings: Secondary | ICD-10-CM | POA: Diagnosis not present

## 2020-12-17 DIAGNOSIS — M353 Polymyalgia rheumatica: Secondary | ICD-10-CM

## 2020-12-17 DIAGNOSIS — M159 Polyosteoarthritis, unspecified: Secondary | ICD-10-CM

## 2020-12-17 DIAGNOSIS — Z7189 Other specified counseling: Secondary | ICD-10-CM | POA: Diagnosis not present

## 2020-12-17 LAB — COMPREHENSIVE METABOLIC PANEL
ALT: 29 U/L (ref 0–53)
AST: 21 U/L (ref 0–37)
Albumin: 4 g/dL (ref 3.5–5.2)
Alkaline Phosphatase: 47 U/L (ref 39–117)
BUN: 23 mg/dL (ref 6–23)
CO2: 27 mEq/L (ref 19–32)
Calcium: 9.5 mg/dL (ref 8.4–10.5)
Chloride: 105 mEq/L (ref 96–112)
Creatinine, Ser: 0.98 mg/dL (ref 0.40–1.50)
GFR: 80.14 mL/min (ref >60.00)
Glucose, Bld: 86 mg/dL (ref 70–99)
Potassium: 4.5 mEq/L (ref 3.5–5.1)
Sodium: 138 mEq/L (ref 135–145)
Total Bilirubin: 0.6 mg/dL (ref 0.2–1.2)
Total Protein: 7.1 g/dL (ref 6.0–8.3)

## 2020-12-17 LAB — LIPID PANEL
Cholesterol: 252 mg/dL — ABNORMAL HIGH (ref 0–200)
HDL: 73.9 mg/dL (ref >39.00)
LDL Cholesterol: 161 mg/dL — ABNORMAL HIGH (ref 0–99)
NonHDL: 178.45
Total CHOL/HDL Ratio: 3
Triglycerides: 85 mg/dL (ref 0.0–149.0)
VLDL: 17 mg/dL (ref 0.0–40.0)

## 2020-12-17 LAB — CBC
HCT: 49.2 % (ref 39.0–52.0)
Hemoglobin: 16.4 g/dL (ref 13.0–17.0)
MCHC: 33.4 g/dL (ref 30.0–36.0)
MCV: 93.4 fl (ref 78.0–100.0)
Platelets: 213 10*3/uL (ref 150.0–400.0)
RBC: 5.26 Mil/uL (ref 4.22–5.81)
RDW: 13.4 % (ref 11.5–15.5)
WBC: 6 10*3/uL (ref 4.0–10.5)

## 2020-12-17 LAB — SEDIMENTATION RATE: Sed Rate: 11 mm/hr (ref 0–20)

## 2020-12-17 MED ORDER — EPINEPHRINE 0.3 MG/0.3ML IJ SOAJ: INTRAMUSCULAR | 1 refills | Status: DC

## 2020-12-17 NOTE — Progress Notes (Signed)
Hearing Screening   125Hz  250Hz  500Hz  1000Hz  2000Hz  3000Hz  4000Hz  6000Hz  8000Hz   Right ear:   20 20 20   0    Left ear:   20 20 20   0    Vision Screening Comments: December 2021

## 2020-12-17 NOTE — Addendum Note (Signed)
Addended by: Pilar Grammes on: 12/17/2020 04:38 PM   Modules accepted: Orders

## 2020-12-17 NOTE — Assessment & Plan Note (Signed)
Still only moderate risk due to high HDL Will hold off on statin still

## 2020-12-17 NOTE — Assessment & Plan Note (Signed)
See social history 

## 2020-12-17 NOTE — Patient Instructions (Signed)
You can try over the counter diclofenac gel--- 2-3 times a day on your wrists as needed.

## 2020-12-17 NOTE — Progress Notes (Signed)
Subjective:    Patient ID: Alexander Cohen, male    DOB: 1956/06/13, 65 y.o.   MRN: [id number]  HPI Here for Medicare wellness visit and follow up of chronic health conditions This visit occurred during the SARS-CoV-2 public health emergency.  Safety protocols were in place, including screening questions prior to the visit, additional usage of staff PPE, and extensive cleaning of exam room while observing appropriate contact time as indicated for disinfecting solutions.   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 Did a little work here and there--but basically retired  He feels the arthritis is getting worse Very hard to push up from the floor--like with a push up Mostly in wrists Will take ibuprofen rarely--helps some May have some swelling--nothing striking  Also has pain in knees Thinks he may have to consider TKR eventually  Has to be careful walking down stairs or on the golf course  Sleep is "just terrible" Has tried benedryl and melatonin Best is 5-6 hours Prefers to get up at 6:30AM but usually up much earlier Stays up later to try to sleep later No snoring or apnea Some daytime fatigue--slow to get going in the morning No naps  Still on 5mg  prednisone daily Tried to cut back---will cause the shoulder pain again (down to 2.5mg  daily)  Ongoing issues with hearing Has been avoiding evaluation for this He does miss things  Still prefers no statin for his cholesterol  Current Outpatient Medications on File Prior to Visit  Medication Sig Dispense Refill  . EPIPEN 2-PAK 0.3 MG/0.3ML SOAJ injection INJECT 1 PEN SUBCUTANEOUSLY AS DIRECTED 1 Device 1  . predniSONE (DELTASONE) 5 MG tablet TAKE ONE TABLET BY MOUTH DAILY 90 tablet 1    No current facility-administered medications on file prior to visit.    Allergies  Allergen Reactions  . Other Anaphylaxis    Pine Nuts    Past Medical History:  Diagnosis Date  . ADHD (attention deficit hyperactivity disorder), inattentive type   . Anxiety   . Hyperlipidemia   . Nasal polyp   . Osteoarthritis     Past Surgical History:  Procedure Laterality Date  . INGUINAL HERNIA REPAIR  09/1999   (Dr. Quillian Quince)  . KNEE ARTHROSCOPY  1974   Right  . KNEE CARTILAGE SURGERY  1973   Left    Family History  Problem Relation Age of Onset  . Alcohol abuse Mother   . Alcohol abuse Father   . Diabetes Maternal Grandmother   . Colon cancer Neg Hx     Social History   Socioeconomic History  . Marital status: Married    Spouse name: Not on file  . Number of children: 3  . Years of education: Not on file  . Highest education level: Not on file  Occupational History  . 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 Smoking status: Never Smoker  . Smokeless tobacco: Never Used  Substance and Sexual Activity  . Alcohol use: Yes    Alcohol/week: 4.0 standard drinks    Types: 4 Glasses of wine per week  . Drug use: No  . Sexual activity: Not on file  Other Topics Concern  . Not on file  Social History Narrative   Has living will   Wife, then son Alexander Cohen, is health care POA   Would accept resuscitation   No tube feeds if cognitively unaware   Social Determinants of Health   Financial Resource Strain: Not on file  Food Insecurity: Not on file  Transportation Needs: Not on file  Physical Activity: Not on file  Stress: Not on file  Social Connections: Not on file  Intimate Partner Violence: Not on file   Review of Systems Appetite is fine Weight fairly stable Wears seat belt Teeth are fine---keeps up with dentist No concerning skin lesions No chest pain or  SOB No dizziness or syncope No edema Rare heartburn. No dysphagia Bowels move fine---no blood. Voids okay---nocturia stable at once.    Objective:   Physical Exam Constitutional:      Appearance: Normal appearance.  HENT:     Mouth/Throat:     Comments: No lesions Eyes:     Conjunctiva/sclera: Conjunctivae normal.     Pupils: Pupils are equal, round, and reactive to light.  Cardiovascular:     Rate and Rhythm: Normal rate and regular rhythm.     Pulses: Normal pulses.     Heart sounds: No murmur heard. No gallop.   Pulmonary:     Effort: Pulmonary effort is normal.     Breath sounds: Normal breath sounds. No wheezing or rales.  Abdominal:     Palpations: Abdomen is soft.     Tenderness: There is no abdominal tenderness.  Musculoskeletal:     Cervical back: Neck supple.     Right lower leg: No edema.     Left lower leg: No edema.  Lymphadenopathy:     Cervical: No cervical adenopathy.  Skin:    General: Skin is warm.     Findings: No rash.  Neurological:     Mental Status: He is alert and oriented to person, place, and time.     Comments: President--- "Alexander Cohen, Alexander Cohen, Alexander Cohen" [phone] D-l-r-o-w Recall 3/3  Psychiatric:        Mood and Affect: Mood normal.        Behavior: Behavior normal.            Assessment & Plan:

## 2020-12-17 NOTE — Assessment & Plan Note (Signed)
I have personally reviewed the Medicare Annual Wellness questionnaire and have noted 1. The patient's medical and social history 2. Their use of alcohol, tobacco or illicit drugs 3. Their current medications and supplements 4. The patient's functional ability including ADL's, fall risks, home safety risks and hearing or visual             impairment. 5. Diet and physical activities 6. Evidence for depression or mood disorders  The patients weight, height, BMI and visual acuity have been recorded in the chart I have made referrals, counseling and provided education to the patient based review of the above and I have provided the pt with a written personalized care plan for preventive services.  I have provided you with a copy of your personalized plan for preventive services. Please take the time to review along with your updated medication list.  Had COVID vaccine and flu prevnar today Exercises regularly Colon due later this year Defer PSA to next year

## 2020-12-17 NOTE — Assessment & Plan Note (Signed)
He is going to look into hearing aides

## 2020-12-17 NOTE — Assessment & Plan Note (Signed)
Does okay with the 5mg  daily Can try to decrease to 2.5mg  a couple of days a week

## 2020-12-17 NOTE — Assessment & Plan Note (Signed)
Mostly wrists and knees Uses ibuprofen at times Discussed trying topical diclofenac Not close to ready for TKR

## 2021-02-05 ENCOUNTER — Telehealth: Payer: Self-pay | Admitting: *Deleted

## 2021-02-05 MED ORDER — AMOXICILLIN-POT CLAVULANATE 875-125 MG PO TABS: 1.0000 | ORAL_TABLET | Freq: Two times a day (BID) | ORAL | 0 refills | Status: DC

## 2021-02-05 NOTE — Telephone Encounter (Signed)
Please let him know that I did send an antibiotic to his Alexander Cohen for him. Hopefully that will help and he will have a nice trip

## 2021-02-05 NOTE — Telephone Encounter (Signed)
Patient called stating that he has a sinus infection and will be leaving to go out of the country tomorrow morning. Patient stated he went to the dentist two weeks ago because his teeth were hurting. Patient stated that the dentist told him that it looked like the problem was with his sinuses and put him on an antibiotic for 7 days. Patient stated that he finished that antibiotic last week. Patient stated that he was feeling better and then started feeling bad again Sunday. Patient stated that he has been using nasal spray and allka seltzer sinus. Patient stated that he is sure that it is a sinus infection because he gets one very year. Patient denies a fever. Patient stated that he did a home covid test Monday and it was negative. Patient stated that he has an appointment today at 4:00 for another covid test so that he can go out of the country tomorrow. Patient stated that he does not remember the antibiotic that his dentist gave him. Patient stated that he is hoping that Dr. Silvio Pate can help him out. [facility]

## 2021-02-05 NOTE — Telephone Encounter (Signed)
Patient notified as instructed instructed by telephone and verbalized understanding. Patient stated that he appreciated the help with this so much.

## 2021-05-09 ENCOUNTER — Other Ambulatory Visit: Payer: Self-pay

## 2021-05-09 ENCOUNTER — Ambulatory Visit: Payer: Medicare PPO | Admitting: Internal Medicine

## 2021-05-09 ENCOUNTER — Encounter: Payer: Self-pay | Admitting: Internal Medicine

## 2021-05-09 DIAGNOSIS — H6120 Impacted cerumen, unspecified ear: Secondary | ICD-10-CM | POA: Insufficient documentation

## 2021-05-09 DIAGNOSIS — H6121 Impacted cerumen, right ear: Secondary | ICD-10-CM

## 2021-05-09 NOTE — Progress Notes (Signed)
   Subjective:    Patient ID: Alexander Cohen, male    DOB: 1955/12/22, 65 y.o.   MRN: [id number]  HPI Here due to wax buildup This visit occurred during the SARS-CoV-2 public health emergency.  Safety protocols were in place, including screening questions prior to the visit, additional usage of staff PPE, and extensive cleaning of exam room while observing appropriate contact time as indicated for disinfecting solutions.   Went to [facility] to consider hearing aides Right ear was occluded with cerumen Needs it cleared out  Current Outpatient Medications on File Prior to Visit  Medication Sig Dispense Refill   EPINEPHrine (EPIPEN 2-PAK) 0.3 mg/0.3 mL IJ SOAJ injection INJECT 1 PEN SUBCUTANEOUSLY AS DIRECTED 1 each 1   predniSONE (DELTASONE) 5 MG tablet TAKE ONE TABLET BY MOUTH DAILY 90 tablet 1   No current facility-administered medications on file prior to visit.    Allergies  Allergen Reactions   Other Anaphylaxis    Pine Nuts    Past Medical History:  Diagnosis Date   ADHD (attention deficit hyperactivity disorder), inattentive type    Anxiety    Hyperlipidemia    Nasal polyp    Osteoarthritis     Past Surgical History:  Procedure Laterality Date   INGUINAL HERNIA REPAIR  09/1999   (Dr. 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[employer]    Comment: Barrister's clerk     Comment:    Tobacco Use   Smoking status: Never   Smokeless tobacco: Never  Substance and Sexual  Activity   Alcohol use: Yes    Alcohol/week: 4.0 standard drinks    Types: 4 Glasses of wine per week   Drug use: No   Sexual activity: Not on file  Other Topics Concern   Not on file  Social History Narrative   Has living will   Wife, then son Jenny Reichmann, is health care POA   Would accept resuscitation   No tube feeds if cognitively unaware   Social Determinants of Health   Financial Resource Strain: Not on file  Food Insecurity: Not on file  Transportation Needs: Not on file  Physical Activity: Not on file  Stress: Not on file  Social Connections: Not on file  Intimate Partner Violence: Not on file   Review of Systems     Objective:   Physical Exam HENT:     Ears:     Comments: Left canal clear Right completely occluded with cerumen          Assessment & Plan:

## 2021-05-09 NOTE — Assessment & Plan Note (Signed)
Finally cleared after several rounds of drops and flushing Canal now clear with mild inflammation Okay to get audiogram and proceed with aides

## 2021-05-26 ENCOUNTER — Other Ambulatory Visit: Payer: Self-pay | Admitting: Internal Medicine

## 2021-07-09 ENCOUNTER — Other Ambulatory Visit: Payer: Self-pay

## 2021-07-09 ENCOUNTER — Ambulatory Visit: Payer: Self-pay

## 2021-07-09 ENCOUNTER — Ambulatory Visit (INDEPENDENT_AMBULATORY_CARE_PROVIDER_SITE_OTHER): Payer: Medicare PPO

## 2021-07-09 ENCOUNTER — Ambulatory Visit: Payer: Medicare PPO | Admitting: Orthopaedic Surgery

## 2021-07-09 ENCOUNTER — Encounter: Payer: Self-pay | Admitting: Orthopaedic Surgery

## 2021-07-09 DIAGNOSIS — M1712 Unilateral primary osteoarthritis, left knee: Secondary | ICD-10-CM | POA: Diagnosis not present

## 2021-07-09 DIAGNOSIS — M1711 Unilateral primary osteoarthritis, right knee: Secondary | ICD-10-CM

## 2021-07-09 DIAGNOSIS — M25561 Pain in right knee: Secondary | ICD-10-CM

## 2021-07-09 DIAGNOSIS — M25562 Pain in left knee: Secondary | ICD-10-CM

## 2021-07-09 DIAGNOSIS — M1710 Unilateral primary osteoarthritis, unspecified knee: Secondary | ICD-10-CM | POA: Diagnosis not present

## 2021-07-09 NOTE — Progress Notes (Signed)
Office Visit Note   Patient: Alexander Cohen           Date of Birth: [birth year]           MRN: [id number] Visit Date: 07/09/2021              Requested by: Venia Carbon, MD [place],  [place] [id number] PCP: Venia Carbon, MD   Assessment & Plan: Visit Diagnoses:  1. Primary osteoarthritis of right knee   2. Primary osteoarthritis of left knee     Plan: Alexander Cohen has fairly advanced DJD but symptomatically speaking he is not in that much pain and with the exception of a couple of complaints he is able to tolerate the symptoms and they are not preventing him from doing the activities that he wants to do.  For now he will continue his same treatments that he has been doing and he mainly wants reassurance from me that he does not absolutely need knee replacement.  I explained that it is an elective surgery meant to improve quality of life.  Questions encouraged and answered.  He has my card if he has any questions in future.  Follow-up as needed.  Follow-Up Instructions: Return if symptoms worsen or fail to improve.   Orders:  Orders Placed This Encounter  Procedures   XR KNEE 3 VIEW RIGHT   XR KNEE 3 VIEW LEFT   No orders of the defined types were placed in this encounter.     Procedures: No procedures performed   Clinical Data: No additional findings.   Subjective: Chief Complaint  Patient presents with   Right Knee - New Patient (Initial Visit)   Left Knee - New Patient (Initial Visit)    Alexander Cohen is a very pleasant 65 year old gentleman retired principal from [employer] who comes in for evaluation of bilateral knee pain worse on the left.  He is here for appointment mainly at his wife's behest and urging from family and friends.  His main complaints is that he has sensation of giving way in his left knee and he has trouble with squatting and kneeling but overall he is still able to remain very active and has relatively manageable  pain.  He wears a knee compression brace at times.  He is not had any cortisone injections or really taking any medications on a regular basis.  He has had prior open meniscectomies back in the 1970s.  He has some trouble going down steps due to the giving way.   Review of Systems  Constitutional: Negative.   All other systems reviewed and are negative.   Objective: Vital Signs: There were no vitals taken for this visit.  Physical Exam Vitals and nursing note reviewed.  Constitutional:      Appearance: He is well-developed.  HENT:     Head: Normocephalic and atraumatic.  Eyes:     Pupils: Pupils are equal, round, and reactive to light.  Pulmonary:     Effort: Pulmonary effort is normal.  Abdominal:     Palpations: Abdomen is soft.  Musculoskeletal:        General: Normal range of motion.     Cervical back: Neck supple.  Skin:    General: Skin is warm.  Neurological:     Mental Status: He is alert and oriented to person, place, and time.  Psychiatric:        Behavior: Behavior normal.        Thought  Content: Thought content normal.        Judgment: Judgment normal.    Ortho Exam Left knee shows a mild valgus deformity.  Significant amount of crepitus with range of motion.  Mild to moderate restriction in range of motion.  No significant discomfort with range of motion.  Collaterals and cruciates are grossly intact.  Right knee shows mild to moderate restriction range of motion.  Collaterals and cruciates are stable.  A fair amount of crepitus with range of motion. Specialty Comments:  No specialty comments available.  Imaging: XR KNEE 3 VIEW LEFT  Result Date: 07/09/2021 Advanced tricompartmental DJD with valgus deformity  XR KNEE 3 VIEW RIGHT  Result Date: 07/09/2021 Moderately severe tricompartmental DJD    PMFS History: Patient Active Problem List   Diagnosis Date Noted   Cerumen impaction 05/09/2021   Hearing loss 12/15/2019   Advance directive discussed  with patient 12/15/2019   Generalized osteoarthritis 09/10/2014   Routine general medical examination at a health care facility 04/19/2012   Hyperlipidemia    PMR (polymyalgia rheumatica) ([place]) 12/07/2011   OSTEOARTHRITIS 05/23/2007   Past Medical History:  Diagnosis Date   ADHD (attention deficit hyperactivity disorder), inattentive type    Anxiety    Hyperlipidemia    Nasal polyp    Osteoarthritis     Family History  Problem Relation Age of Onset   Alcohol abuse Mother    Alcohol abuse Father    Diabetes Maternal Grandmother    Colon cancer Neg Hx     Past Surgical History:  Procedure Laterality Date   INGUINAL HERNIA REPAIR  09/1999   (Dr. 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

## 2021-07-25 DIAGNOSIS — H814 Vertigo of central origin: Secondary | ICD-10-CM | POA: Diagnosis not present

## 2021-08-18 ENCOUNTER — Telehealth: Payer: Self-pay

## 2021-08-18 ENCOUNTER — Ambulatory Visit: Payer: Medicare PPO | Admitting: Internal Medicine

## 2021-08-18 ENCOUNTER — Encounter: Payer: Self-pay | Admitting: Internal Medicine

## 2021-08-18 ENCOUNTER — Other Ambulatory Visit: Payer: Self-pay

## 2021-08-18 DIAGNOSIS — J0110 Acute frontal sinusitis, unspecified: Secondary | ICD-10-CM | POA: Diagnosis not present

## 2021-08-18 MED ORDER — AMOXICILLIN-POT CLAVULANATE 875-125 MG PO TABS: 1.0000 | ORAL_TABLET | Freq: Two times a day (BID) | ORAL | 0 refills | Status: DC

## 2021-08-18 NOTE — Telephone Encounter (Signed)
[place] Night - Client Nonclinical Telephone Record AccessNurse Client [facility] Night - Client Client Site [place] Physician Viviana Simpler- MD Contact Type Call Who Is Calling Patient / Member / Family / Caregiver Caller Name [place] Phone Number [phone] Patient Name Alexander Cohen Patient DOB [birth year] Call Type Message Only Information Provided Reason for Call Request to Schedule Office Appointment Initial Comment Caller states they are experiencing sinus infection and would like to make an appointment for today. Patient request to speak to RN No Additional Comment Provided office hours. Disp. Time Disposition Final User 08/18/2021 7:52:08 AM General Information Provided Yes Ian Malkin Call Closed By: Ian Malkin Transaction Date/Time: 08/18/2021 7:48:41 AM (ET)

## 2021-08-18 NOTE — Patient Instructions (Signed)
Please stop the nasal spray--I think if it is a vasoconstrictor, it is making your nasal congestion worse.  Start cetirizine 10mg  daily or loratadine 10-20mg  daily for the allergy symptoms.

## 2021-08-18 NOTE — Telephone Encounter (Signed)
Pt already has appt with Dr Silvio Pate 08/18/21 at 2:15. Sending note to Dr Silvio Pate and [name] CMA.

## 2021-08-18 NOTE — Telephone Encounter (Signed)
Okay--I will see him at today's visit

## 2021-08-18 NOTE — Assessment & Plan Note (Signed)
Seems to have allergy etiology as well Discussed stopping the nasal spray (I fear it is vasoconstrictor) augmentin Discussed antihistamines

## 2021-08-18 NOTE — Progress Notes (Signed)
Subjective:    Patient ID: Alexander Cohen, male    DOB: [birth year], 65 y.o.   MRN: [id number]  HPI Here due to sinus problems This visit occurred during the SARS-CoV-2 public health emergency.  Safety protocols were in place, including screening questions prior to the visit, additional usage of staff PPE, and extensive cleaning of exam room while observing appropriate contact time as indicated for disinfecting solutions.   Not unusual to have fall symptoms Bad congestion for 2 weeks Using nasal sprays OTC decongestants Hearing is off No fever Some cough---more last week---robitussin some help Some frontal pressure--no major headache Lots of post nasal drip---clear to heavy thick mucus  Ears feel clogged  Current Outpatient Medications on File Prior to Visit  Medication Sig Dispense Refill   EPINEPHrine (EPIPEN 2-PAK) 0.3 mg/0.3 mL IJ SOAJ injection INJECT 1 PEN SUBCUTANEOUSLY AS DIRECTED 1 each 1   predniSONE (DELTASONE) 5 MG tablet TAKE ONE TABLET BY MOUTH DAILY 90 tablet 1   No current facility-administered medications on file prior to visit.    Allergies  Allergen Reactions   Other Anaphylaxis    Pine Nuts    Past Medical History:  Diagnosis Date   ADHD (attention deficit hyperactivity disorder), inattentive type    Anxiety    Hyperlipidemia    Nasal polyp    Osteoarthritis     Past Surgical History:  Procedure Laterality Date   INGUINAL HERNIA REPAIR  09/1999   (Dr. 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     Comment:    Tobacco Use   Smoking status: Never   Smokeless tobacco: Never  Substance and Sexual Activity   Alcohol use: Yes    Alcohol/week: 4.0 standard drinks    Types: 4 Glasses of wine per week   Drug use: No   Sexual activity: Not on file  Other Topics Concern   Not on file  Social History Narrative   Has living will   Wife, then son Jenny Reichmann, is health care POA   Would accept resuscitation   No tube feeds if cognitively unaware   Social Determinants of Health   Financial Resource Strain: Not on file  Food Insecurity: Not on file  Transportation Needs: Not on file  Physical Activity: Not on file  Stress: Not on file  Social Connections: Not on file  Intimate Partner Violence: Not on file   Review of Systems Hard to sleep due to nasal congestion    Objective:   Physical Exam Constitutional:      Appearance: Normal appearance.  HENT:     Head:     Comments: Frontal pressure noted    Nose:     Comments: Marked mostly pale nasal congestion    Mouth/Throat:     Pharynx: No oropharyngeal exudate or posterior oropharyngeal  erythema.  Pulmonary:     Effort: Pulmonary effort is normal.     Breath sounds: Normal breath sounds. No wheezing or rales.  Musculoskeletal:     Cervical back: Neck supple.  Lymphadenopathy:     Cervical: No cervical adenopathy.  Neurological:     Mental Status: He is alert.           Assessment & Plan:

## 2021-08-28 DIAGNOSIS — H814 Vertigo of central origin: Secondary | ICD-10-CM | POA: Diagnosis not present

## 2021-09-17 DIAGNOSIS — H814 Vertigo of central origin: Secondary | ICD-10-CM | POA: Diagnosis not present

## 2021-10-15 DIAGNOSIS — H814 Vertigo of central origin: Secondary | ICD-10-CM | POA: Diagnosis not present

## 2021-11-07 DIAGNOSIS — H814 Vertigo of central origin: Secondary | ICD-10-CM | POA: Diagnosis not present

## 2021-11-14 DIAGNOSIS — H814 Vertigo of central origin: Secondary | ICD-10-CM | POA: Diagnosis not present

## 2021-11-24 DIAGNOSIS — H814 Vertigo of central origin: Secondary | ICD-10-CM | POA: Diagnosis not present

## 2021-12-05 DIAGNOSIS — H814 Vertigo of central origin: Secondary | ICD-10-CM | POA: Diagnosis not present

## 2021-12-12 DIAGNOSIS — H814 Vertigo of central origin: Secondary | ICD-10-CM | POA: Diagnosis not present

## 2021-12-19 ENCOUNTER — Other Ambulatory Visit: Payer: Self-pay | Admitting: Internal Medicine

## 2021-12-22 ENCOUNTER — Telehealth: Payer: Self-pay | Admitting: Internal Medicine

## 2021-12-22 NOTE — Telephone Encounter (Signed)
Pt has an appointment tomorrow and is asking if he also can get a drug test for his job. Please advise.

## 2021-12-22 NOTE — Telephone Encounter (Signed)
Spoke to pt. Advised him that we can do urine drug screening but there is not a routine screening. He will find out by his appt tomorrow if he needs a 7, 9, or other amount panel screening.

## 2021-12-23 ENCOUNTER — Ambulatory Visit (INDEPENDENT_AMBULATORY_CARE_PROVIDER_SITE_OTHER): Payer: Medicare PPO | Admitting: Internal Medicine

## 2021-12-23 ENCOUNTER — Encounter: Payer: Self-pay | Admitting: Internal Medicine

## 2021-12-23 ENCOUNTER — Telehealth: Payer: Self-pay

## 2021-12-23 ENCOUNTER — Other Ambulatory Visit: Payer: Self-pay

## 2021-12-23 VITALS — BP 126/84 | HR 77 | Temp 96.9°F | Ht 70.0 in | Wt 200.0 lb

## 2021-12-23 DIAGNOSIS — Z125 Encounter for screening for malignant neoplasm of prostate: Secondary | ICD-10-CM

## 2021-12-23 DIAGNOSIS — M159 Polyosteoarthritis, unspecified: Secondary | ICD-10-CM | POA: Diagnosis not present

## 2021-12-23 DIAGNOSIS — M353 Polymyalgia rheumatica: Secondary | ICD-10-CM

## 2021-12-23 DIAGNOSIS — Z0000 Encounter for general adult medical examination without abnormal findings: Secondary | ICD-10-CM

## 2021-12-23 DIAGNOSIS — E785 Hyperlipidemia, unspecified: Secondary | ICD-10-CM | POA: Diagnosis not present

## 2021-12-23 DIAGNOSIS — G479 Sleep disorder, unspecified: Secondary | ICD-10-CM | POA: Insufficient documentation

## 2021-12-23 LAB — LIPID PANEL
Cholesterol: 235 mg/dL — ABNORMAL HIGH (ref 0–200)
HDL: 64 mg/dL (ref >39.00)
LDL Cholesterol: 153 mg/dL — ABNORMAL HIGH (ref 0–99)
NonHDL: 171.21
Total CHOL/HDL Ratio: 4
Triglycerides: 92 mg/dL (ref 0.0–149.0)
VLDL: 18.4 mg/dL (ref 0.0–40.0)

## 2021-12-23 LAB — CBC
HCT: 48.5 % (ref 39.0–52.0)
Hemoglobin: 15.9 g/dL (ref 13.0–17.0)
MCHC: 32.8 g/dL (ref 30.0–36.0)
MCV: 92.6 fl (ref 78.0–100.0)
Platelets: 241 10*3/uL (ref 150.0–400.0)
RBC: 5.24 Mil/uL (ref 4.22–5.81)
RDW: 13.1 % (ref 11.5–15.5)
WBC: 5.4 10*3/uL (ref 4.0–10.5)

## 2021-12-23 LAB — COMPREHENSIVE METABOLIC PANEL
ALT: 25 U/L (ref 0–53)
AST: 18 U/L (ref 0–37)
Albumin: 4.1 g/dL (ref 3.5–5.2)
Alkaline Phosphatase: 44 U/L (ref 39–117)
BUN: 22 mg/dL (ref 6–23)
CO2: 31 mEq/L (ref 19–32)
Calcium: 9.3 mg/dL (ref 8.4–10.5)
Chloride: 104 mEq/L (ref 96–112)
Creatinine, Ser: 1.01 mg/dL (ref 0.40–1.50)
GFR: 76.74 mL/min (ref >60.00)
Glucose, Bld: 95 mg/dL (ref 70–99)
Potassium: 4.3 mEq/L (ref 3.5–5.1)
Sodium: 139 mEq/L (ref 135–145)
Total Bilirubin: 0.8 mg/dL (ref 0.2–1.2)
Total Protein: 6.7 g/dL (ref 6.0–8.3)

## 2021-12-23 LAB — PSA, MEDICARE: PSA: 3.03 ng/ml (ref 0.10–4.00)

## 2021-12-23 LAB — SEDIMENTATION RATE: Sed Rate: 10 mm/hr (ref 0–20)

## 2021-12-23 NOTE — Progress Notes (Signed)
Hearing Screening - Comments:: Has hearing aids. Wearing them today. Vision Screening - Comments:: December 2022

## 2021-12-23 NOTE — Assessment & Plan Note (Signed)
Mostly in knees Not ready for TKR Discussed trying diclofenac gel prn

## 2021-12-23 NOTE — Assessment & Plan Note (Signed)
Doing well on prednisone 5mg  daily Hasn't been able to wean from there

## 2021-12-23 NOTE — Assessment & Plan Note (Signed)
Ongoing issues Uses melatonin 5-10 Advised against benedryl Discussed sleep compression Consider trazodone 50-100 if worsens

## 2021-12-23 NOTE — Assessment & Plan Note (Signed)
I have personally reviewed the Medicare Annual Wellness questionnaire and have noted 1. The patient's medical and social history 2. Their use of alcohol, tobacco or illicit drugs 3. Their current medications and supplements 4. The patient's functional ability including ADL's, fall risks, home safety risks and hearing or visual             impairment. 5. Diet and physical activities 6. Evidence for depression or mood disorders  The patients weight, height, BMI and visual acuity have been recorded in the chart I have made referrals, counseling and provided education to the patient based review of the above and I have provided the pt with a written personalized care plan for preventive services.  I have provided you with a copy of your personalized plan for preventive services. Please take the time to review along with your updated medication list.  Seems due for colonoscopy--will check with Dr Ardis Hughs Will check PSA Exercises regularly Will check Kristopher Oppenheim about imms--may have had second pneumonia vaccine Flu vaccine in the fall ?second shingrix

## 2021-12-23 NOTE — Assessment & Plan Note (Signed)
Still prefers no statin for this

## 2021-12-23 NOTE — Patient Instructions (Signed)
You can try diclofenac gel (over the counter) on your knees for pain (like before golf).

## 2021-12-23 NOTE — Progress Notes (Signed)
Subjective:    Patient ID: Alexander Cohen, male    DOB: [birth year], 66 y.o.   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 Temporarily will be [employer]  Still having trouble getting a good nights sleep Has tried melatonin/benedryl---only 5-6 hours at best No apnea per wife No alcohol after dinner Many days has daytime somnolence---naps in afternoon on weekends Nocturia x 1-2---not the cause of problems  Continues on the prednisone 5mg  daily Did try hard to slowly wean off---didn't do well (clear worsening after 4-6 weeks)  Ongoing arthritis pain--mostly knees Did meet Dr Xu--not ready for TKR yet  Prefers no cholesterol meds Eats healthy Weight down a few pounds No chest pain or SOB No syncope ---but did have some vertigo (worked with physiotherapist and this is better)  Current Outpatient Medications on File Prior to Visit  Medication Sig Dispense Refill   EPINEPHrine (EPIPEN 2-PAK) 0.3 mg/0.3 mL IJ SOAJ injection INJECT 1 PEN SUBCUTANEOUSLY AS DIRECTED 1 each 1   predniSONE (DELTASONE) 5 MG tablet TAKE ONE TABLET BY MOUTH DAILY 90 tablet 3   No current facility-administered medications on file prior to visit.    Allergies  Allergen Reactions   Other Anaphylaxis    Pine Nuts    Past Medical History:  Diagnosis Date   ADHD (attention deficit hyperactivity disorder), inattentive  type    Anxiety    Hyperlipidemia    Nasal polyp    Osteoarthritis     Past Surgical History:  Procedure Laterality Date   INGUINAL HERNIA REPAIR  09/1999   (Dr. 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[employer]    Comment: Barrister's clerk     Comment:    Tobacco Use   Smoking status: Never    Passive exposure: Past   Smokeless tobacco: Never  Substance and Sexual Activity   Alcohol use: Yes    Alcohol/week: 4.0 standard drinks    Types: 4 Glasses of wine per week   Drug use: No   Sexual activity: Not on file  Other Topics Concern   Not on file  Social History Narrative   Has living will   Wife, then son Jenny Reichmann, is health care POA   Would accept resuscitation   No tube feeds if cognitively unaware   Social Determinants of Health   Financial Resource Strain:  Not on file  Food Insecurity: Not on file  Transportation Needs: Not on file  Physical Activity: Not on file  Stress: Not on file  Social Connections: Not on file  Intimate Partner Violence: Not on file   Review of Systems Appetite is "more manageable"--for portion control Wears seat belt Teeth are fine--sees dentist Right hand CTS symptoms--may have been related to gym equipment No suspicious skin lesions Occasional edema---if eats salt Rare heartburn---tums helps. No dysphagia Bowels are fine--no blood Urine flow is fine    Objective:   Physical Exam Constitutional:      Appearance: Normal appearance.   HENT:     Mouth/Throat:     Comments: No lesions Eyes:     Conjunctiva/sclera: Conjunctivae normal.     Pupils: Pupils are equal, round, and reactive to light.  Cardiovascular:     Rate and Rhythm: Normal rate and regular rhythm.     Pulses: Normal pulses.     Heart sounds: No murmur heard.   No gallop.  Pulmonary:     Effort: Pulmonary effort is normal.     Breath sounds: Normal breath sounds. No wheezing or rales.  Abdominal:     Palpations: Abdomen is soft.     Tenderness: There is no abdominal tenderness.  Musculoskeletal:     Cervical back: Neck supple.     Right lower leg: No edema.     Left lower leg: No edema.  Lymphadenopathy:     Cervical: No cervical adenopathy.  Skin:    Findings: No lesion or rash.  Neurological:     General: No focal deficit present.     Mental Status: He is alert and oriented to person, place, and time.     Comments: Mini-Cog--normal  Psychiatric:        Mood and Affect: Mood normal.        Behavior: Behavior normal.           Assessment & Plan:

## 2021-12-23 NOTE — Telephone Encounter (Signed)
-----   Message from Milus Banister, MD sent at 12/23/2021  8:30 AM EST ----- Got it, thanks.  Not sure what happened but we sent a recall letter at 3 years in 08/2019. I suspect covid had something to do with the delay.   Elmyra Ricks, He needs recall colonoscopy around now.  Thanks   ----- Message ----- From: Venia Carbon, MD Sent: 12/23/2021   8:03 AM EST To: Milus Banister, MD  Dan, Just making sure he is on the colonoscopy recall list. 2017 listed 3 years but he was told it was it was delayed. Rich

## 2021-12-23 NOTE — Telephone Encounter (Signed)
Left message for patient to return call to be scheduled for recall colonoscopy.  Will continue efforts.

## 2021-12-25 ENCOUNTER — Encounter: Payer: Self-pay | Admitting: Internal Medicine

## 2021-12-25 ENCOUNTER — Encounter: Payer: Self-pay | Admitting: Gastroenterology

## 2021-12-25 LAB — DRUG MONITORING, PANEL 8 WITH CONFIRMATION, URINE
6 Acetylmorphine: NEGATIVE ng/mL (ref <10)
Alcohol Metabolites: POSITIVE ng/mL — AB (ref <500)
Amphetamines: NEGATIVE ng/mL (ref <500)
Benzodiazepines: NEGATIVE ng/mL (ref <100)
Buprenorphine, Urine: NEGATIVE ng/mL (ref <5)
Cocaine Metabolite: NEGATIVE ng/mL (ref <150)
Creatinine: 175.8 mg/dL (ref >=20.0)
Ethyl Glucuronide (ETG): 7111 ng/mL — ABNORMAL HIGH (ref <500)
Ethyl Sulfate (ETS): 1488 ng/mL — ABNORMAL HIGH (ref <100)
MDMA: NEGATIVE ng/mL (ref <500)
Marijuana Metabolite: NEGATIVE ng/mL (ref <20)
Opiates: NEGATIVE ng/mL (ref <100)
Oxidant: NEGATIVE ug/mL (ref <200)
Oxycodone: NEGATIVE ng/mL (ref <100)
pH: 6 (ref 4.5–9.0)

## 2021-12-25 LAB — DM TEMPLATE

## 2021-12-25 NOTE — Telephone Encounter (Signed)
Scheduled PV 3/31 [place] 4/28

## 2022-01-30 ENCOUNTER — Other Ambulatory Visit: Payer: Self-pay

## 2022-01-30 ENCOUNTER — Ambulatory Visit (AMBULATORY_SURGERY_CENTER): Payer: Medicare PPO

## 2022-01-30 VITALS — Ht 70.0 in | Wt 200.0 lb

## 2022-01-30 DIAGNOSIS — Z86010 Personal history of colonic polyps: Secondary | ICD-10-CM

## 2022-01-30 NOTE — Progress Notes (Signed)
No egg or soy allergy known to patient  ?No issues known to pt with past sedation with any surgeries or procedures ?Patient denies ever being told they had issues or difficulty with intubation  ?No FH of Malignant Hyperthermia ?Pt is not on diet pills ?Pt is not on home 02  ?Pt is not on blood thinners  ?Pt denies issues with constipation;  ?No A fib or A flutter ?NO PA's for preps discussed with pt in PV today  ?Discussed with pt there will be an out-of-pocket cost for prep and that varies from $0 to 70 + dollars - pt verbalized understanding  ?Due to the COVID-19 pandemic we are asking patients to follow certain guidelines in PV and the [place]   ?Pt aware of COVID protocols and [facility] guidelines  ?PV completed over the phone. Pt verified name, DOB, address and insurance during PV today.  ?Pt mailed instruction packet with copy of consent form to read and not return, and instructions.  ?Pt encouraged to call with questions or issues.  ?If pt has My chart, procedure instructions sent via My Chart  ? ?

## 2022-02-18 ENCOUNTER — Encounter: Payer: Self-pay | Admitting: Gastroenterology

## 2022-02-27 ENCOUNTER — Encounter: Payer: Medicare PPO | Admitting: Gastroenterology

## 2022-07-23 ENCOUNTER — Telehealth: Payer: Self-pay

## 2022-07-23 ENCOUNTER — Encounter: Payer: Self-pay | Admitting: Family

## 2022-07-23 ENCOUNTER — Ambulatory Visit: Payer: Medicare PPO | Admitting: Family

## 2022-07-23 VITALS — BP 136/80 | HR 75 | Temp 97.8°F | Resp 16 | Ht 70.0 in | Wt 206.2 lb

## 2022-07-23 DIAGNOSIS — R21 Rash and other nonspecific skin eruption: Secondary | ICD-10-CM | POA: Diagnosis not present

## 2022-07-23 DIAGNOSIS — B350 Tinea barbae and tinea capitis: Secondary | ICD-10-CM | POA: Diagnosis not present

## 2022-07-23 MED ORDER — CLOTRIMAZOLE-BETAMETHASONE 1-0.05 % EX CREA: 1.0000 | TOPICAL_CREAM | Freq: Every day | CUTANEOUS | 0 refills | Status: DC

## 2022-07-23 MED ORDER — DOXYCYCLINE HYCLATE 100 MG PO TABS: 100.0000 mg | ORAL_TABLET | Freq: Two times a day (BID) | ORAL | 0 refills | Status: AC

## 2022-07-23 NOTE — Telephone Encounter (Signed)
Saw patient already, thank you for speaking with the patient.  Please see note for further information.  

## 2022-07-23 NOTE — Progress Notes (Signed)
Established Patient Office Visit  Subjective:  Patient ID: Alexander Cohen, male    DOB: 03/18/1956  Age: 66 y.o. MRN: [id number]  CC:  Chief Complaint  Patient presents with   Rash    On head X 10 days itches and burns tried all kind of thing on it with no luck    HPI Dream Nodal Tetreault is here today with concerns.   Rash on head for the last ten days. The rash is burning and itching. He thinks maybe a yeast infection of his scalp? He has been working outside often and was really sweating on his head and under a hat, and has since been more irritating. Was applying some ointment . Was applying some steroid cream with only mild relief.   Past Medical History:  Diagnosis Date   ADHD (attention deficit hyperactivity disorder), inattentive type    Anxiety    Hyperlipidemia    Nasal polyp    Osteoarthritis     Past Surgical History:  Procedure Laterality Date   APPENDECTOMY  1964   COLONOSCOPY  2017   DJ-MAC-suprep(exc)-polyps   INGUINAL HERNIA REPAIR Left 09/03/1999   (Dr. 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    Fish farm manager: [employer]    Comment: Barrister's clerk     Comment:    Tobacco Use   Smoking status: Never    Passive exposure: Past   Smokeless tobacco: Never  Vaping Use   Vaping Use: Never used  Substance and Sexual Activity   Alcohol use: Not  Currently    Alcohol/week: 0.0 - 7.0 standard drinks of alcohol   Drug use: No   Sexual activity: Not on file  Other Topics Concern   Not on file  Social History Narrative   Has living will   Wife, then son Jenny Reichmann, is health care POA   Would accept resuscitation   No tube feeds if cognitively unaware   Social Determinants of Health   Financial Resource Strain: Not on file  Food Insecurity: Not on file  Transportation Needs: Not on file  Physical Activity: Not on file  Stress: Not on file  Social Connections: Not on file  Intimate Partner Violence: Not on file    Outpatient Medications Prior to Visit  Medication Sig Dispense Refill   EPINEPHrine (EPIPEN 2-PAK) 0.3 mg/0.3 mL IJ SOAJ injection INJECT 1 PEN SUBCUTANEOUSLY AS DIRECTED 1 each 1   predniSONE (DELTASONE) 5 MG tablet TAKE ONE TABLET BY MOUTH DAILY 90 tablet 3   No facility-administered medications prior to visit.    Allergies  Allergen Reactions   Other Anaphylaxis    Pine Nuts        Objective:  Physical Exam Skin:    Comments: Erythema and irritation to scalp with scattered raised cystic nodules with yellow pus, small in nature. Scaly annular rings toward frontal aspect     Erythema and irritation to scalp with scattered raised cystic nodules with yellow pus, small in nature. Scaly annular rings toward frontal aspect    BP 136/80   Pulse 75   Temp 97.8 F (36.6 C)   Resp 16   Ht _0  (1.778 m)   Wt 206 lb 4 oz (93.6 kg)   SpO2 94%   BMI 29.59 kg/m  Wt Readings from Last 3 Encounters:  07/23/22 206 lb 4 oz (93.6 kg)  01/30/22 200 lb (90.7 kg)  12/23/21 200 lb (90.7 kg)     Health Maintenance Due  Topic Date Due   Hepatitis C Screening  Never done   COLONOSCOPY (Pts 45-49yr Insurance coverage will need to be confirmed)  09/19/2019   COVID-19 Vaccine (4 - Pfizer series) 10/21/2020   INFLUENZA VACCINE  06/02/2022    There are no preventive care reminders to display for this  patient.  Lab Results  Component Value Date   TSH 2.17 09/15/2013   Lab Results  Component Value Date   WBC 5.4 12/23/2021   HGB 15.9 12/23/2021   HCT 48.5 12/23/2021   MCV 92.6 12/23/2021   PLT 241.0 12/23/2021   Lab Results  Component Value Date   NA 139 12/23/2021   K 4.3 12/23/2021   CO2 31 12/23/2021   GLUCOSE 95 12/23/2021   BUN 22 12/23/2021   CREATININE 1.01 12/23/2021   BILITOT 0.8 12/23/2021   ALKPHOS 44 12/23/2021   AST 18 12/23/2021   ALT 25 12/23/2021   PROT 6.7 12/23/2021   ALBUMIN 4.1 12/23/2021   CALCIUM 9.3 12/23/2021   GFR 76.74 12/23/2021   No results found for: "HGBA1C"    Assessment & Plan:   Problem List Items Addressed This Visit       Musculoskeletal and Integument   Localized skin eruption    Suspected folliculitis and scalp tinea capitus  rx lotrisone cream as well as rx  RX doxycycline 100 mg po bid x 10 days  If no improvement will need to f/u with derm Avoid sweating, keep area dry as able Wash with light gentle fragrance free soap and water       Relevant Medications   clotrimazole-betamethasone (LOTRISONE) cream   doxycycline (VIBRA-TABS) 100 MG tablet   Other Relevant Orders   Ambulatory referral to Dermatology   RESOLVED: Tinea capitis - Primary   Relevant Medications   clotrimazole-betamethasone (LOTRISONE) cream   doxycycline (VIBRA-TABS) 100 MG tablet   Other Relevant Orders   Ambulatory referral to Dermatology    Meds ordered this encounter  Medications   clotrimazole-betamethasone (LOTRISONE) cream    Sig: Apply 1 Application topically daily.    Dispense:  30 g    Refill:  0    Order Specific Question:   Supervising Provider    Answer:   BEDSOLE, AMY E [[id number]]   doxycycline (VIBRA-TABS) 100 MG tablet    Sig: Take 1 tablet (100 mg total) by mouth 2 (two) times daily for 10 days.    Dispense:  20 tablet    Refill:  0    Order Specific Question:   Supervising Provider    Answer:   BDiona Browner AMY E [[id number]]     Follow-up: Return for f/u with primary care provider if no improvement.    Bridie Colquhoun  Phebe Colla, FNP

## 2022-07-23 NOTE — Telephone Encounter (Signed)
Per appt notes pt already has appt with T Dugal FNP 07/23/22 at 11:00am. Sending note to Red Christians FNP,    [facility] - Client Nonclinical Telephone Record  [facility] Client [facility] - Client Client Site [facility] Provider Viviana Simpler- MD Contact Type Call Who Is Calling Patient / Member / Family / Caregiver Caller Name Sawyer Phone Number [phone] Patient Name Alexander Cohen Patient DOB 05-21-2056 Call Type Message Only Information Provided Reason for Call Request to Schedule Office Appointment Initial Comment Has a rash on top of his head for past week, OTC is not working. Wants same day appt. Pls call back Patient request to speak to RN No Disp. Time Disposition Final User 07/23/2022 7:48:32 AM General Information Provided Yes Donato Heinz Call Closed By: Donato Heinz Transaction Date/Time: 07/23/2022 7:46:07 AM (ET

## 2022-07-23 NOTE — Patient Instructions (Signed)
  Cream to apply twice daily for up to two weeks and take oral medicine.  If no improvement see dermatology.  Regards,   Eugenia Pancoast FNP-C

## 2022-07-24 ENCOUNTER — Encounter: Payer: Self-pay | Admitting: Family

## 2022-07-24 DIAGNOSIS — R21 Rash and other nonspecific skin eruption: Secondary | ICD-10-CM | POA: Insufficient documentation

## 2022-07-24 DIAGNOSIS — B350 Tinea barbae and tinea capitis: Secondary | ICD-10-CM

## 2022-07-24 NOTE — Telephone Encounter (Signed)
Changed referral, requesting to cancel referral placed yesterday 9/21.

## 2022-07-24 NOTE — Assessment & Plan Note (Signed)
Suspected folliculitis and scalp tinea capitus  rx lotrisone cream as well as rx  RX doxycycline 100 mg po bid x 10 days  If no improvement will need to f/u with derm Avoid sweating, keep area dry as able Wash with light gentle fragrance free soap and water

## 2022-08-17 MED ORDER — CLOTRIMAZOLE-BETAMETHASONE 1-0.05 % EX CREA: 1.0000 | TOPICAL_CREAM | Freq: Every day | CUTANEOUS | 0 refills | Status: DC

## 2022-08-17 MED ORDER — TERBINAFINE HCL 250 MG PO TABS: 250.0000 mg | ORAL_TABLET | Freq: Every day | ORAL | 0 refills | Status: AC

## 2022-09-04 NOTE — Telephone Encounter (Signed)
Referral was updated per request. See referral notes

## 2022-09-15 DIAGNOSIS — L570 Actinic keratosis: Secondary | ICD-10-CM | POA: Diagnosis not present

## 2022-09-15 DIAGNOSIS — L738 Other specified follicular disorders: Secondary | ICD-10-CM | POA: Diagnosis not present

## 2022-10-29 ENCOUNTER — Telehealth: Payer: Medicare PPO | Admitting: Emergency Medicine

## 2022-10-29 DIAGNOSIS — J329 Chronic sinusitis, unspecified: Secondary | ICD-10-CM

## 2022-10-29 DIAGNOSIS — R6889 Other general symptoms and signs: Secondary | ICD-10-CM

## 2022-10-29 MED ORDER — AMOXICILLIN-POT CLAVULANATE 875-125 MG PO TABS: 1.0000 | ORAL_TABLET | Freq: Two times a day (BID) | ORAL | 0 refills | Status: DC

## 2022-10-29 MED ORDER — OSELTAMIVIR PHOSPHATE 75 MG PO CAPS: 75.0000 mg | ORAL_CAPSULE | Freq: Two times a day (BID) | ORAL | 0 refills | Status: DC

## 2022-10-29 NOTE — Progress Notes (Signed)
Virtual Visit Consent   Alexander Cohen, you are scheduled for a virtual visit with a [facility] provider today. Just as with appointments in the office, your consent must be obtained to participate. Your consent will be active for this visit and any virtual visit you may have with one of our providers in the next 365 days. If you have a MyChart account, a copy of this consent can be sent to you electronically.  As this is a virtual visit, video technology does not allow for your provider to perform a traditional examination. This may limit your provider's ability to fully assess your condition. If your provider identifies any concerns that need to be evaluated in person or the need to arrange testing (such as labs, EKG, etc.), we will make arrangements to do so. Although advances in technology are sophisticated, we cannot ensure that it will always work on either your end or our end. If the connection with a video visit is poor, the visit may have to be switched to a telephone visit. With either a video or telephone visit, we are not always able to ensure that we have a secure connection.  By engaging in this virtual visit, you consent to the provision of healthcare and authorize for your insurance to be billed (if applicable) for the services provided during this visit. Depending on your insurance coverage, you may receive a charge related to this service.  I need to obtain your verbal consent now. Are you willing to proceed with your visit today? [name] has provided verbal consent on 10/29/2022 for a virtual visit (video or telephone). Montine Circle, PA-C  Date: 10/29/2022 8:47 AM  Virtual Visit via Video Note   I, Montine Circle, connected with  Alexander Cohen  ([id number], October 07, 1956) on 10/29/22 at  8:45 AM EST by a video-enabled telemedicine application and verified that I am speaking with the correct person using two identifiers.  Location: Patient: Virtual Visit Location  Patient: Home Provider: Virtual Visit Location Provider: Home Office   I discussed the limitations of evaluation and management by telemedicine and the availability of in person appointments. The patient expressed understanding and agreed to proceed.    History of Present Illness: Alexander Cohen is a 66 y.o. who identifies as a male who was assigned male at birth, and is being seen today for sinus infection.  States that he has been having symptoms for the past 2 weeks.  States that he has also been exposed to the flu.  States that his whole family has had flu.  He reports feeling short of breath, he says this is minor.  He reports having had cough and associated SOB.  Has mild body aches and fatigue.  He denies fever.  HPI: HPI  Problems:  Patient Active Problem List   Diagnosis Date Noted   Localized skin eruption 07/24/2022   Sleep disorder 12/23/2021   Hearing loss 12/15/2019   Advance directive discussed with patient 12/15/2019   Generalized osteoarthritis 09/10/2014   Routine general medical examination at a health care facility 04/19/2012   Hyperlipidemia    PMR (polymyalgia rheumatica) ([place]) 12/07/2011   OSTEOARTHRITIS 05/23/2007    Allergies:  Allergies  Allergen Reactions   Other Anaphylaxis    Pine Nuts   Medications:  Current Outpatient Medications:    clotrimazole-betamethasone (LOTRISONE) cream, Apply 1 Application topically daily., Disp: 30 g, Rfl: 0   EPINEPHrine (EPIPEN 2-PAK) 0.3 mg/0.3 mL IJ SOAJ injection, INJECT 1 PEN SUBCUTANEOUSLY  AS DIRECTED, Disp: 1 each, Rfl: 1   predniSONE (DELTASONE) 5 MG tablet, TAKE ONE TABLET BY MOUTH DAILY, Disp: 90 tablet, Rfl: 3  Observations/Objective: Patient is well-developed, well-nourished in no acute distress.  Resting comfortably  at home.  Head is normocephalic, atraumatic.  No labored breathing.  Speech is clear and coherent with logical content.  Patient is alert and oriented at baseline.    Assessment and  Plan: 1. Sinusitis, unspecified chronicity, unspecified location  2. Flu-like symptoms  Patient has had symptoms of sinus congestion that is worsening for over a week, and now is also reporting flu like symptoms with recent close exposure to flu.  He may benefit from dual therapy.    Meds ordered this encounter  Medications   amoxicillin-clavulanate (AUGMENTIN) 875-125 MG tablet    Sig: Take 1 tablet by mouth every 12 (twelve) hours.    Dispense:  14 tablet    Refill:  0    Order Specific Question:   Supervising Provider    Answer:   Chase Picket [id number]   oseltamivir (TAMIFLU) 75 MG capsule    Sig: Take 1 capsule (75 mg total) by mouth 2 (two) times daily.    Dispense:  10 capsule    Refill:  0    Order Specific Question:   Supervising Provider    Answer:   Chase Picket [id number]       Follow Up Instructions: I discussed the assessment and treatment plan with the patient. The patient was provided an opportunity to ask questions and all were answered. The patient agreed with the plan and demonstrated an understanding of the instructions.  A copy of instructions were sent to the patient via MyChart unless otherwise noted below.     The patient was advised to call back or seek an in-person evaluation if the symptoms worsen or if the condition fails to improve as anticipated.  Time:  I spent 15 minutes with the patient via telehealth technology discussing the above problems/concerns.    Montine Circle, PA-C

## 2022-10-29 NOTE — Patient Instructions (Signed)
  Jenne Pane Sprecher, thank you for joining Montine Circle, PA-C for today's virtual visit.  While this provider is not your primary care provider (PCP), if your PCP is located in our provider database this encounter information will be shared with them immediately following your visit.   [facility] account gives you access to today's visit and all your visits, tests, and labs performed at [facility] " click here if you don't have a [facility] account or go to [email or web address]  Consent: (Patient) Alexander Cohen provided verbal consent for this virtual visit at the beginning of the encounter.  Current Medications:  Current Outpatient Medications:    amoxicillin-clavulanate (AUGMENTIN) 875-125 MG tablet, Take 1 tablet by mouth every 12 (twelve) hours., Disp: 14 tablet, Rfl: 0   oseltamivir (TAMIFLU) 75 MG capsule, Take 1 capsule (75 mg total) by mouth 2 (two) times daily., Disp: 10 capsule, Rfl: 0   clotrimazole-betamethasone (LOTRISONE) cream, Apply 1 Application topically daily., Disp: 30 g, Rfl: 0   EPINEPHrine (EPIPEN 2-PAK) 0.3 mg/0.3 mL IJ SOAJ injection, INJECT 1 PEN SUBCUTANEOUSLY AS DIRECTED, Disp: 1 each, Rfl: 1   predniSONE (DELTASONE) 5 MG tablet, TAKE ONE TABLET BY MOUTH DAILY, Disp: 90 tablet, Rfl: 3   Medications ordered in this encounter:  Meds ordered this encounter  Medications   amoxicillin-clavulanate (AUGMENTIN) 875-125 MG tablet    Sig: Take 1 tablet by mouth every 12 (twelve) hours.    Dispense:  14 tablet    Refill:  0    Order Specific Question:   Supervising Provider    Answer:   Chase Picket [id number]   oseltamivir (TAMIFLU) 75 MG capsule    Sig: Take 1 capsule (75 mg total) by mouth 2 (two) times daily.    Dispense:  10 capsule    Refill:  0    Order Specific Question:   Supervising Provider    Answer:   Chase Picket [id number]     *If you need refills on other medications prior to your next appointment,  please contact your pharmacy*  Follow-Up: Call back or seek an in-person evaluation if the symptoms worsen or if the condition fails to improve as anticipated.  [facility] [phone]  Other Instructions    If you have been instructed to have an in-person evaluation today at a local Urgent Care facility, please use the link below. It will take you to a list of all of our available [facility], including address, phone number and hours of operation. Please do not delay care.  [facility]  If you or a family member do not have a primary care provider, use the link below to schedule a visit and establish care. When you choose a [facility] primary care physician or advanced practice provider, you gain a long-term partner in health. Find a Primary Care Provider  Learn more about [facility]'s in-office and virtual care options: [facility]

## 2022-12-07 DIAGNOSIS — H5203 Hypermetropia, bilateral: Secondary | ICD-10-CM | POA: Diagnosis not present

## 2022-12-07 DIAGNOSIS — H52223 Regular astigmatism, bilateral: Secondary | ICD-10-CM | POA: Diagnosis not present

## 2022-12-07 DIAGNOSIS — H524 Presbyopia: Secondary | ICD-10-CM | POA: Diagnosis not present

## 2022-12-07 DIAGNOSIS — H25012 Cortical age-related cataract, left eye: Secondary | ICD-10-CM | POA: Diagnosis not present

## 2022-12-07 DIAGNOSIS — H25013 Cortical age-related cataract, bilateral: Secondary | ICD-10-CM | POA: Diagnosis not present

## 2022-12-18 DIAGNOSIS — L738 Other specified follicular disorders: Secondary | ICD-10-CM | POA: Diagnosis not present

## 2022-12-18 DIAGNOSIS — L578 Other skin changes due to chronic exposure to nonionizing radiation: Secondary | ICD-10-CM | POA: Diagnosis not present

## 2022-12-25 ENCOUNTER — Encounter: Payer: Medicare PPO | Admitting: Internal Medicine

## 2022-12-28 ENCOUNTER — Ambulatory Visit (INDEPENDENT_AMBULATORY_CARE_PROVIDER_SITE_OTHER): Payer: Medicare PPO | Admitting: Internal Medicine

## 2022-12-28 ENCOUNTER — Encounter: Payer: Self-pay | Admitting: Internal Medicine

## 2022-12-28 VITALS — BP 118/74 | HR 80 | Temp 97.4°F | Ht 70.0 in | Wt 203.0 lb

## 2022-12-28 DIAGNOSIS — M353 Polymyalgia rheumatica: Secondary | ICD-10-CM

## 2022-12-28 DIAGNOSIS — Z0000 Encounter for general adult medical examination without abnormal findings: Secondary | ICD-10-CM

## 2022-12-28 DIAGNOSIS — G479 Sleep disorder, unspecified: Secondary | ICD-10-CM

## 2022-12-28 DIAGNOSIS — E785 Hyperlipidemia, unspecified: Secondary | ICD-10-CM | POA: Diagnosis not present

## 2022-12-28 DIAGNOSIS — M65342 Trigger finger, left ring finger: Secondary | ICD-10-CM | POA: Diagnosis not present

## 2022-12-28 LAB — CBC
HCT: 47.8 % (ref 39.0–52.0)
Hemoglobin: 16.1 g/dL (ref 13.0–17.0)
MCHC: 33.7 g/dL (ref 30.0–36.0)
MCV: 93.3 fl (ref 78.0–100.0)
Platelets: 228 10*3/uL (ref 150.0–400.0)
RBC: 5.12 Mil/uL (ref 4.22–5.81)
RDW: 13.6 % (ref 11.5–15.5)
WBC: 6.5 10*3/uL (ref 4.0–10.5)

## 2022-12-28 LAB — LIPID PANEL
Cholesterol: 238 mg/dL — ABNORMAL HIGH (ref 0–200)
HDL: 69.2 mg/dL (ref >39.00)
LDL Cholesterol: 149 mg/dL — ABNORMAL HIGH (ref 0–99)
NonHDL: 168.61
Total CHOL/HDL Ratio: 3
Triglycerides: 100 mg/dL (ref 0.0–149.0)
VLDL: 20 mg/dL (ref 0.0–40.0)

## 2022-12-28 LAB — COMPREHENSIVE METABOLIC PANEL
ALT: 27 U/L (ref 0–53)
AST: 20 U/L (ref 0–37)
Albumin: 3.7 g/dL (ref 3.5–5.2)
Alkaline Phosphatase: 46 U/L (ref 39–117)
BUN: 19 mg/dL (ref 6–23)
CO2: 28 mEq/L (ref 19–32)
Calcium: 9.6 mg/dL (ref 8.4–10.5)
Chloride: 105 mEq/L (ref 96–112)
Creatinine, Ser: 0.97 mg/dL (ref 0.40–1.50)
GFR: 79.98 mL/min (ref >60.00)
Glucose, Bld: 89 mg/dL (ref 70–99)
Potassium: 4.6 mEq/L (ref 3.5–5.1)
Sodium: 139 mEq/L (ref 135–145)
Total Bilirubin: 0.7 mg/dL (ref 0.2–1.2)
Total Protein: 6.7 g/dL (ref 6.0–8.3)

## 2022-12-28 LAB — SEDIMENTATION RATE: Sed Rate: 14 mm/hr (ref 0–20)

## 2022-12-28 MED ORDER — EPINEPHRINE 0.3 MG/0.3ML IJ SOAJ: INTRAMUSCULAR | 1 refills | Status: AC

## 2022-12-28 NOTE — Assessment & Plan Note (Signed)
I have personally reviewed the Medicare Annual Wellness questionnaire and have noted 1. The patient's medical and social history 2. Their use of alcohol, tobacco or illicit drugs 3. Their current medications and supplements 4. The patient's functional ability including ADL's, fall risks, home safety risks and hearing or visual             impairment. 5. Diet and physical activities 6. Evidence for depression or mood disorders  The patients weight, height, BMI and visual acuity have been recorded in the chart I have made referrals, counseling and provided education to the patient based review of the above and I have provided the pt with a written personalized care plan for preventive services.  I have provided you with a copy of your personalized plan for preventive services. Please take the time to review along with your updated medication list.  He will set up the colonoscopy Discussed PSA--will defer this year Exercises regularly Recommended flu/COVID/RSV next fall

## 2022-12-28 NOTE — Progress Notes (Signed)
Subjective:    Patient ID: Alexander Cohen, male    DOB: 1956-05-01, 67 y.o.   MRN: [id number]  HPI Here for Medicare wellness visit and follow up of chronic health conditions Reviewed advanced directives Reviewed other doctors----Dr Coop--optometrist, Dr Christophe Louis, [facility] dermatology No hospitalizations or surgery in the past year Still exercises regularly Vision is okay---may need cataract soon Hearing is fair with hearing aides No falls No depression or anhedonia Regular wine and occ bourbon No tobacco Independent with instrumental ADLs No sig memory issues--just recall  Having trouble with left hand Tightness in 4th finger and worsening triggering  Also with swelling at [employer] with pain  Did finish his assignment last August Now into his 3rd retirement  Still having sleep problems Melatonin not helpful--not taking  Occasionally takes benedryl--only when he is worn down Finishes dinner wine early--to make sure that is not an issue  Continues on '5mg'$  prednisone every other day---then uses daily if it worsens Maintains function  Still prefers no meds for cholesterol  Current Outpatient Medications on File Prior to Visit  Medication Sig Dispense Refill   EPINEPHrine (EPIPEN 2-PAK) 0.3 mg/0.3 mL IJ SOAJ injection INJECT 1 PEN SUBCUTANEOUSLY AS DIRECTED 1 each 1   predniSONE (DELTASONE) 5 MG tablet TAKE ONE TABLET BY MOUTH DAILY 90 tablet 3   No current facility-administered medications on file prior to visit.    Allergies  Allergen Reactions   Other Anaphylaxis    Pine Nuts    Past Medical History:  Diagnosis Date   ADHD (attention deficit hyperactivity disorder), inattentive type    Anxiety    Hyperlipidemia    Nasal polyp    Osteoarthritis     Past Surgical History:  Procedure Laterality Date   APPENDECTOMY  1964   COLONOSCOPY  2017   DJ-MAC-suprep(exc)-polyps   INGUINAL HERNIA REPAIR Left 09/03/1999   (Dr. Quillian Quince)   [place]   11/03/1971   Left   KNEE SURGERY Right 1974    Family History  Problem Relation Age of Onset   Alcohol abuse Mother    Alcohol abuse Father    Colon polyps Sister 7   Diabetes Maternal Grandmother    Colon cancer Neg Hx    Esophageal cancer Neg Hx    Prostate cancer Neg Hx    Stomach cancer Neg Hx    Rectal cancer Neg Hx     Social History   Socioeconomic History   Marital status: Married    Spouse name: Not on file   Number of children: 3   Years of education: Not on file   Highest education level: Not on file  Occupational History   Occupation: Principal---retired    Employer: [employer]    Comment: Barrister's clerk     Comment:    Tobacco Use   Smoking status: Never    Passive exposure: Past   Smokeless tobacco: Never  Vaping Use   Vaping Use: Never used  Substance and Sexual Activity   Alcohol use: Not Currently    Alcohol/week: 0.0 - 7.0 standard drinks of alcohol   Drug use: No   Sexual activity: Not on file  Other Topics Concern   Not on file  Social History Narrative   Has living will   Wife, then son Jenny Reichmann, is health care POA   Would accept resuscitation   No tube feeds if cognitively unaware   Social Determinants of Health   Financial Resource Strain: Not on [employer]  Insecurity: Not on file  Transportation Needs: Not on file  Physical Activity: Not on file  Stress: Not on file  Social Connections: Not on file  Intimate Partner Violence: Not on file   Review of Systems Eating fine Weight stable Wears seat belt Needed treatment for scalp actinics---now improved Tinea capitis--better now Occasional heartburn---tums. No dysphagia Bowels move fine--no blood Voids okay--reasonable stream and good emptying No sig back or joint pains---just hand pain    Objective:   Physical Exam Constitutional:      Appearance: Normal appearance.  HENT:     Mouth/Throat:     Pharynx: No oropharyngeal exudate or posterior oropharyngeal  erythema.  Eyes:     Conjunctiva/sclera: Conjunctivae normal.     Pupils: Pupils are equal, round, and reactive to light.  Cardiovascular:     Rate and Rhythm: Normal rate and regular rhythm.     Pulses: Normal pulses.     Heart sounds:     No gallop.  Pulmonary:     Effort: Pulmonary effort is normal.     Breath sounds: Normal breath sounds. No wheezing or rales.  Abdominal:     Palpations: Abdomen is soft.     Tenderness: There is no abdominal tenderness.  Musculoskeletal:     Cervical back: Neck supple.     Right lower leg: No edema.     Left lower leg: No edema.     Comments: Soft tissue mass at extensor left CMC--?lipoma Left 4th flexor tendon thickened with slight triggering  Lymphadenopathy:     Cervical: No cervical adenopathy.  Skin:    Findings: No lesion or rash.  Neurological:     General: No focal deficit present.     Mental Status: He is alert and oriented to person, place, and time.     Comments: Word naming 15/1 minute Recall 3/3  Psychiatric:        Mood and Affect: Mood normal.        Behavior: Behavior normal.            Assessment & Plan:

## 2022-12-28 NOTE — Assessment & Plan Note (Signed)
Will refer to hand surgeon

## 2022-12-28 NOTE — Assessment & Plan Note (Signed)
Still prefers no statin

## 2022-12-28 NOTE — Assessment & Plan Note (Signed)
Ongoing Uses benedryl occasionally Discussed trazodone--but will hold off

## 2022-12-28 NOTE — Patient Instructions (Signed)
Please call to get your colonoscopy set up

## 2022-12-28 NOTE — Assessment & Plan Note (Signed)
Controlled with prednisone '5mg'$  daily or every other day Will check sed rate

## 2023-01-07 ENCOUNTER — Encounter: Payer: Self-pay | Admitting: Orthopaedic Surgery

## 2023-01-07 ENCOUNTER — Ambulatory Visit: Payer: Medicare PPO | Admitting: Orthopaedic Surgery

## 2023-01-07 ENCOUNTER — Ambulatory Visit (INDEPENDENT_AMBULATORY_CARE_PROVIDER_SITE_OTHER): Payer: Medicare PPO

## 2023-01-07 DIAGNOSIS — M65342 Trigger finger, left ring finger: Secondary | ICD-10-CM | POA: Diagnosis not present

## 2023-01-07 DIAGNOSIS — M79642 Pain in left hand: Secondary | ICD-10-CM | POA: Diagnosis not present

## 2023-01-07 MED ORDER — METHYLPREDNISOLONE ACETATE 40 MG/ML IJ SUSP
13.3300 mg | INTRAMUSCULAR | Status: AC | PRN
  Administered 2023-01-07: 13.33 mg

## 2023-01-07 MED ORDER — LIDOCAINE HCL 1 % IJ SOLN
0.3000 mL | INTRAMUSCULAR | Status: AC | PRN
  Administered 2023-01-07: .3 mL

## 2023-01-07 MED ORDER — BUPIVACAINE HCL 0.5 % IJ SOLN
0.3300 mL | INTRAMUSCULAR | Status: AC | PRN
  Administered 2023-01-07: .33 mL

## 2023-01-07 NOTE — Progress Notes (Signed)
Office Visit Note   Patient: Alexander Cohen           Date of Birth: October 10, 1956           MRN: [id number] Visit Date: 01/07/2023              Requested by: Venia Carbon, MD [place],  [place] [address] PCP: Venia Carbon, MD   Assessment & Plan: Visit Diagnoses:  1. Pain in left hand   2. Trigger finger, left ring finger     Plan: Pression is left hand cyst, left ring trigger finger, left hand early Dupuytren's contracture.  In regards to the cyst, would like to order an MRI with contrast due to the size.  He will follow-up with [place] once this is been completed.  In regards to the trigger finger, we discussed cortisone injection and night splint versus surgery.  He would like to first proceed with the injection and night splint.  In regards to the Dupuytren's contracture, we will continue to follow.  He will call with concerns or questions.  Follow-Up Instructions: Return for f/u after mri.   Orders:  Orders Placed This Encounter  Procedures   XR Hand Complete Left   MR Wrist Left w/ contrast   No orders of the defined types were placed in this encounter.     Procedures: Hand/UE Inj: L ring A1 for trigger finger on 01/07/2023 4:57 PM Indications: pain Details: 25 G needle Medications: 0.3 mL lidocaine 1 %; 0.33 mL bupivacaine 0.5 %; 13.33 mg methylPREDNISolone acetate 40 MG/ML Outcome: tolerated well, no immediate complications Consent was given by the patient. Patient was prepped and draped in the usual sterile fashion.       Clinical Data: No additional findings.   Subjective: Chief Complaint  Patient presents with   Left Hand - Pain    HPI patient is a pleasant 67 year old right-hand-dominant gentleman who comes in today with concerns about his left hand.  He has been having symptoms consistent with a trigger finger to the left ring finger for about 2 years which has worsened over the past 6 to 8 weeks.  No specific injury or change  in activity.  He was seen by his PCP last year where this was brought up and was sent to therapy.  This has not helped.  He does not have any history of trigger finger and other fingers.  He is not diabetic.  The other issue he brings up today is a mass to the dorsum of the left wrist.  He noticed this about 6 weeks ago.  This is increased in size.  He denies any injury or change in activity.  The discomfort he has is primarily across the dorsum of the wrist.  He noticed increased symptoms when using his chainsaw recently.  He does not take medication for this.  He notes occasional tingling to that hand.  He denies any fevers or chills or any other constitutional symptoms.  No other cyst.  Review of Systems as detailed in HPI.  All others reviewed and are negative.   Objective: Vital Signs: There were no vitals taken for this visit.  Physical Exam well-developed and well-nourished gentleman in no acute distress.  Alert and oriented x 3.  Ortho Exam left hand exam: Ring finger does show palpable nodule at the A1 pulley.  This is minimally tender.  He does appear to have palpable cords to the ring and long fingers.  Negative tabletop test.  He has a fairly large soft mass to the dorsum of the wrist.  This is nontender.  He has slight discomfort with flexion, extension, radial and ulnar deviation.  He is neurovascular intact distally.  Specialty Comments:  No specialty comments available.  Imaging: XR Hand Complete Left  Result Date: 01/07/2023 X-rays demonstrate advanced radiocarpal arthritis as well as CMC arthritis to the first joint.    PMFS History: Patient Active Problem List   Diagnosis Date Noted   Trigger finger, left ring finger 12/28/2022   Localized skin eruption 07/24/2022   Sleep disorder 12/23/2021   Hearing loss 12/15/2019   Advance directive discussed with patient 12/15/2019   Generalized osteoarthritis 09/10/2014   Routine general medical examination at a health care  facility 04/19/2012   Hyperlipidemia    PMR (polymyalgia rheumatica) ([place]) 12/07/2011   OSTEOARTHRITIS 05/23/2007   Past Medical History:  Diagnosis Date   ADHD (attention deficit hyperactivity disorder), inattentive type    Anxiety    Hyperlipidemia    Nasal polyp    Osteoarthritis     Family History  Problem Relation Age of Onset   Alcohol abuse Mother    Alcohol abuse Father    Colon polyps Sister 79   Diabetes Maternal Grandmother    Colon cancer Neg Hx    Esophageal cancer Neg Hx    Prostate cancer Neg Hx    Stomach cancer Neg Hx    Rectal cancer Neg Hx     Past Surgical History:  Procedure Laterality Date   APPENDECTOMY  1964   COLONOSCOPY  2017   DJ-MAC-suprep(exc)-polyps   INGUINAL HERNIA REPAIR Left 09/03/1999   (Dr. Quillian Quince)   [place]  11/03/1971   Left   KNEE SURGERY Right 1974   Social History   Occupational History   Occupation: Principal---retired    Employer: [employer]    Comment: Barrister's clerk     Comment:    Tobacco Use   Smoking status: Never    Passive exposure: Past   Smokeless tobacco: Never  Vaping Use   Vaping Use: Never used  Substance and Sexual Activity   Alcohol use: Not Currently    Alcohol/week: 0.0 - 7.0 standard drinks of alcohol   Drug use: No   Sexual activity: Not on file

## 2023-01-13 ENCOUNTER — Encounter: Payer: Self-pay | Admitting: Orthopaedic Surgery

## 2023-01-26 ENCOUNTER — Other Ambulatory Visit: Payer: Self-pay | Admitting: Internal Medicine

## 2023-01-28 ENCOUNTER — Telehealth: Payer: Self-pay | Admitting: Orthopaedic Surgery

## 2023-01-28 NOTE — Telephone Encounter (Signed)
Called pt 1X and left vm for pt to call and set an MRI Review appt with Dr Erlinda Hong after 4/8

## 2023-02-08 ENCOUNTER — Ambulatory Visit
Admission: RE | Admit: 2023-02-08 | Discharge: 2023-02-08 | Disposition: A | Discharge status: Home / Self Care | Payer: Medicare PPO | Source: Ambulatory Visit | Attending: Orthopaedic Surgery | Admitting: Orthopaedic Surgery

## 2023-02-08 DIAGNOSIS — M79642 Pain in left hand: Secondary | ICD-10-CM

## 2023-02-08 MED ORDER — GADOPICLENOL 0.5 MMOL/ML IV SOLN
10.0000 mL | Freq: Once | INTRAVENOUS | Status: AC | PRN
  Administered 2023-02-08: 10 mL via INTRAVENOUS

## 2023-02-10 ENCOUNTER — Ambulatory Visit: Payer: Medicare PPO | Admitting: Orthopaedic Surgery

## 2023-02-10 DIAGNOSIS — M79642 Pain in left hand: Secondary | ICD-10-CM

## 2023-02-10 NOTE — Progress Notes (Signed)
Office Visit Note   Patient: Alexander Cohen           Date of Birth: 10-02-1956           MRN: [id number] Visit Date: 02/10/2023              Requested by: Karie Schwalbe, MD [address] [place],  [place] [address] PCP: Karie Schwalbe, MD   Assessment & Plan: Visit Diagnoses:  1. Pain in left hand     Plan: Impression 67 year old gentleman with extensor tenosynovitis of the second and third compartments.  He has advanced DJD of the [facility] and radiocarpal joints as well.  These findings were reviewed with the patient and treatment options were explained.  He feels that the symptoms are manageable and he can live with this.  He is not bothered by the appearance.  He will follow-up with [place] as needed.  Follow-Up Instructions: No follow-ups on file.   Orders:  No orders of the defined types were placed in this encounter.  No orders of the defined types were placed in this encounter.     Procedures: No procedures performed   Clinical Data: No additional findings.   Subjective: Chief Complaint  Patient presents with   Left Wrist - Follow-up    HPI  Oxley returns today to discuss MRI scan of the left wrist.  Review of Systems   Objective: Vital Signs: There were no vitals taken for this visit.  Physical Exam  Ortho Exam  Examination of left wrist is unchanged.  Specialty Comments:  No specialty comments available.  Imaging: No results found.   PMFS History: Patient Active Problem List   Diagnosis Date Noted   Trigger finger, left ring finger 12/28/2022   Localized skin eruption 07/24/2022   Sleep disorder 12/23/2021   Hearing loss 12/15/2019   Advance directive discussed with patient 12/15/2019   Generalized osteoarthritis 09/10/2014   Routine general medical examination at a health care facility 04/19/2012   Hyperlipidemia    PMR (polymyalgia rheumatica) 12/07/2011   OSTEOARTHRITIS 05/23/2007   Past Medical History:  Diagnosis  Date   ADHD (attention deficit hyperactivity disorder), inattentive type    Anxiety    Hyperlipidemia    Nasal polyp    Osteoarthritis     Family History  Problem Relation Age of Onset   Alcohol abuse Mother    Alcohol abuse Father    Colon polyps Sister 36   Diabetes Maternal Grandmother    Colon cancer Neg Hx    Esophageal cancer Neg Hx    Prostate cancer Neg Hx    Stomach cancer Neg Hx    Rectal cancer Neg Hx     Past Surgical History:  Procedure Laterality Date   APPENDECTOMY  1964   COLONOSCOPY  2017   DJ-MAC-suprep(exc)-polyps   INGUINAL HERNIA REPAIR Left 09/03/1999   (Dr. Reuel Boom)   KNEE CARTILAGE SURGERY  11/03/1971   Left   KNEE SURGERY Right 1974   Social History   Occupational History   Occupation: Principal---retired    Employer: [employer]    Comment: Engineer, petroleum     Comment:    Tobacco Use   Smoking status: Never    Passive exposure: Past   Smokeless tobacco: Never  Vaping Use   Vaping Use: Never used  Substance and Sexual Activity   Alcohol use: Not Currently    Alcohol/week: 0.0 - 7.0 standard drinks of alcohol   Drug use: No  Sexual activity: Not on file

## 2023-12-30 ENCOUNTER — Encounter: Payer: Self-pay | Admitting: Internal Medicine

## 2023-12-30 ENCOUNTER — Ambulatory Visit: Payer: Medicare PPO | Admitting: Internal Medicine

## 2023-12-30 VITALS — BP 122/84 | HR 77 | Temp 97.6°F | Ht 69.75 in | Wt 209.0 lb

## 2023-12-30 DIAGNOSIS — E785 Hyperlipidemia, unspecified: Secondary | ICD-10-CM

## 2023-12-30 DIAGNOSIS — Z1211 Encounter for screening for malignant neoplasm of colon: Secondary | ICD-10-CM

## 2023-12-30 DIAGNOSIS — Z125 Encounter for screening for malignant neoplasm of prostate: Secondary | ICD-10-CM | POA: Diagnosis not present

## 2023-12-30 DIAGNOSIS — M159 Polyosteoarthritis, unspecified: Secondary | ICD-10-CM | POA: Diagnosis not present

## 2023-12-30 DIAGNOSIS — G479 Sleep disorder, unspecified: Secondary | ICD-10-CM | POA: Diagnosis not present

## 2023-12-30 DIAGNOSIS — M353 Polymyalgia rheumatica: Secondary | ICD-10-CM | POA: Diagnosis not present

## 2023-12-30 DIAGNOSIS — Z79899 Other long term (current) drug therapy: Secondary | ICD-10-CM

## 2023-12-30 DIAGNOSIS — Z0000 Encounter for general adult medical examination without abnormal findings: Secondary | ICD-10-CM

## 2023-12-30 LAB — COMPREHENSIVE METABOLIC PANEL
ALT: 26 U/L (ref 0–53)
AST: 17 U/L (ref 0–37)
Albumin: 3.8 g/dL (ref 3.5–5.2)
Alkaline Phosphatase: 50 U/L (ref 39–117)
BUN: 19 mg/dL (ref 6–23)
CO2: 28 meq/L (ref 19–32)
Calcium: 9 mg/dL (ref 8.4–10.5)
Chloride: 105 meq/L (ref 96–112)
Creatinine, Ser: 0.97 mg/dL (ref 0.40–1.50)
GFR: 79.42 mL/min (ref >60.00)
Glucose, Bld: 88 mg/dL (ref 70–99)
Potassium: 4.4 meq/L (ref 3.5–5.1)
Sodium: 139 meq/L (ref 135–145)
Total Bilirubin: 0.8 mg/dL (ref 0.2–1.2)
Total Protein: 6.7 g/dL (ref 6.0–8.3)

## 2023-12-30 LAB — CBC
HCT: 47.9 % (ref 39.0–52.0)
Hemoglobin: 15.6 g/dL (ref 13.0–17.0)
MCHC: 32.6 g/dL (ref 30.0–36.0)
MCV: 93.9 fl (ref 78.0–100.0)
Platelets: 233 10*3/uL (ref 150.0–400.0)
RBC: 5.09 Mil/uL (ref 4.22–5.81)
RDW: 13.7 % (ref 11.5–15.5)
WBC: 6.8 10*3/uL (ref 4.0–10.5)

## 2023-12-30 LAB — LIPID PANEL
Cholesterol: 245 mg/dL — ABNORMAL HIGH (ref 0–200)
HDL: 74.5 mg/dL (ref >39.00)
LDL Cholesterol: 154 mg/dL — ABNORMAL HIGH (ref 0–99)
NonHDL: 170.42
Total CHOL/HDL Ratio: 3
Triglycerides: 82 mg/dL (ref 0.0–149.0)
VLDL: 16.4 mg/dL (ref 0.0–40.0)

## 2023-12-30 LAB — PSA, MEDICARE: PSA: 4.14 ng/mL — ABNORMAL HIGH (ref 0.10–4.00)

## 2023-12-30 LAB — SEDIMENTATION RATE: Sed Rate: 7 mm/h (ref 0–20)

## 2023-12-30 MED ORDER — PREDNISONE 5 MG PO TABS: 5.0000 mg | ORAL_TABLET | Freq: Every day | ORAL | 3 refills | Status: AC

## 2023-12-30 NOTE — Assessment & Plan Note (Signed)
 Doing without any meds now

## 2023-12-30 NOTE — Progress Notes (Signed)
Hearing Screening - Comments:: Has hearing aids. Wearing them today. Vision Screening - Comments:: March 2024

## 2023-12-30 NOTE — Assessment & Plan Note (Signed)
 I have personally reviewed the Medicare Annual Wellness questionnaire and have noted 1. The patient's medical and social history 2. Their use of alcohol, tobacco or illicit drugs 3. Their current medications and supplements 4. The patient's functional ability including ADL's, fall risks, home safety risks and hearing or visual             impairment. 5. Diet and physical activities 6. Evidence for depression or mood disorders  The patients weight, height, BMI and visual acuity have been recorded in the chart I have made referrals, counseling and provided education to the patient based review of the above and I have provided the pt with a written personalized care plan for preventive services.  I have provided you with a copy of your personalized plan for preventive services. Please take the time to review along with your updated medication list.  Overdue for colon---will set up Check PSA Exercises regularly Not taking COVID vaccines---but has had flu/RSV

## 2023-12-30 NOTE — Assessment & Plan Note (Signed)
 Discussed using the topical diclofenac  Aleve if especially bad

## 2023-12-30 NOTE — Assessment & Plan Note (Signed)
 Controlled with prednisone---mostly 5mg  every other day

## 2023-12-30 NOTE — Progress Notes (Signed)
 Subjective:    Patient ID: Alexander Cohen, male    DOB: 12-24-1955, 68 y.o.   MRN: [id number]  HPI Here for Medicare wellness visit and follow up of chronic health conditions Reviewed advanced directives Reviewed other doctors---Dr Coop--opto, Dr Mady Haagensen, [facility] dermatology, Dr Rhett Bannister No hospitalizations or surgery in the past year Exercises regularly Regular wine No tobacco Vision is fine Hearing is okay--hearing aides help No falls No depression or anhedonia Independent with instrumental ADLs Minor memory issues---has to be careful with details  Did work briefly from August to [employer] enjoyed it Not administrative so was good  Ongoing arthritis in hands Hard to open containers at times Uses aleve if really bad Got injection in trigger finger---wears splint at night. Not bothering him much Knees still bother him--but he is holding off on intervention  Seborrhea on face--uses OTC topical No scalp problems  Continues on prednisone every other day--5mg  He does vary at times  Current Outpatient Medications on File Prior to Visit  Medication Sig Dispense Refill   EPINEPHrine (EPIPEN 2-PAK) 0.3 mg/0.3 mL IJ SOAJ injection INJECT 1 PEN SUBCUTANEOUSLY AS DIRECTED 1 each 1   predniSONE (DELTASONE) 5 MG tablet Take 1 tablet (5 mg total) by mouth daily. Or as directed 90 tablet 3   No current facility-administered medications on file prior to visit.    Allergies  Allergen Reactions   Other Anaphylaxis    Pine Nuts    Past Medical History:  Diagnosis Date   ADHD (attention deficit hyperactivity disorder), inattentive type    Anxiety    Hyperlipidemia    Nasal polyp    Osteoarthritis     Past Surgical History:  Procedure Laterality Date   APPENDECTOMY  1964   COLONOSCOPY  2017   DJ-MAC-suprep(exc)-polyps   INGUINAL HERNIA REPAIR Left 09/03/1999   (Dr. Reuel Boom)   KNEE CARTILAGE SURGERY  11/03/1971   Left   KNEE SURGERY Right 1974     Family History  Problem Relation Age of Onset   Alcohol abuse Mother    Alcohol abuse Father    Colon polyps Sister 27   Diabetes Maternal Grandmother    Colon cancer Neg Hx    Esophageal cancer Neg Hx    Prostate cancer Neg Hx    Stomach cancer Neg Hx    Rectal cancer Neg Hx     Social History   Socioeconomic History   Marital status: Married    Spouse name: Not on file   Number of children: 3   Years of education: Not on file   Highest education level: Master's degree (e.g., MA, MS, MEng, MEd, MSW, MBA)  Occupational History   Occupation: Principal---retired    Associate Professor: [employer]    Comment: Engineer, petroleum     Comment:    Tobacco Use   Smoking status: Never    Passive exposure: Past   Smokeless tobacco: Never  Vaping Use   Vaping status: Never Used  Substance and Sexual Activity   Alcohol use: Not Currently    Alcohol/week: 0.0 - 7.0 standard drinks of alcohol   Drug use: No   Sexual activity: Not on file  Other Topics Concern   Not on file  Social History Narrative   Has living will   Wife, then son Jonny Ruiz, is health care POA   Would accept resuscitation   No tube feeds if cognitively unaware   Social Drivers of Health   Financial Resource Strain: Low Risk  (12/26/2023)  Overall Financial Resource Strain (CARDIA)    Difficulty of Paying Living Expenses: Not hard at all  Food Insecurity: No Food Insecurity (12/26/2023)   Hunger Vital Sign    Worried About Running Out of Food in the Last Year: Never true    Ran Out of Food in the Last Year: Never true  Transportation Needs: No Transportation Needs (12/26/2023)   PRAPARE - Administrator, Civil Service (Medical): No    Lack of Transportation (Non-Medical): No  Physical Activity: Sufficiently Active (12/26/2023)   Exercise Vital Sign    Days of Exercise per Week: 5 days    Minutes of Exercise per Session: 60 min  Stress: No Stress Concern Present (12/26/2023)   [employer]  of Occupational Health - Occupational Stress Questionnaire    Feeling of Stress : Only a little  Social Connections: Socially Integrated (12/26/2023)   Social Connection and Isolation Panel [NHANES]    Frequency of Communication with Friends and Family: Twice a week    Frequency of Social Gatherings with Friends and Family: Once a week    Attends Religious Services: More than 4 times per year    Active Member of [employer] or Organizations: Yes    Attends Engineer, structural: More than 4 times per year    Marital Status: Married  Catering manager Violence: Not on file   Review of Systems Appetite is fine--less than in the past Weight back up slightly Chronic sleep issues--- 5.5-6 hours a night. No meds now. Some daytime somnolence--occ nap Wears seat belt Teeth are fine---keeps up with dentist No suspicious skin lesions More frequent heartburn lately--during the day. Tums occasionally. No dysphagia Bowels move fine--no blood Nocturia x 1-2--but not always. Voids okay during the day No chest pain or SOB No syncope. Brief dizziness in AM--if gets up too quick No edema    Objective:   Physical Exam Constitutional:      Appearance: Normal appearance.  HENT:     Mouth/Throat:     Pharynx: No oropharyngeal exudate or posterior oropharyngeal erythema.  Eyes:     Conjunctiva/sclera: Conjunctivae normal.     Pupils: Pupils are equal, round, and reactive to light.  Cardiovascular:     Rate and Rhythm: Normal rate and regular rhythm.     Pulses: Normal pulses.     Heart sounds: No murmur heard.    No gallop.  Pulmonary:     Effort: Pulmonary effort is normal.     Breath sounds: Normal breath sounds. No wheezing or rales.  Abdominal:     Palpations: Abdomen is soft.     Tenderness: There is no abdominal tenderness.  Musculoskeletal:     Cervical back: Neck supple.     Right lower leg: No edema.     Left lower leg: No edema.  Lymphadenopathy:     Cervical: No cervical  adenopathy.  Skin:    Findings: No lesion or rash.  Neurological:     General: No focal deficit present.     Mental Status: He is alert and oriented to person, place, and time.     Comments: Mini-cog----normal  Psychiatric:        Mood and Affect: Mood normal.        Behavior: Behavior normal.            Assessment & Plan:

## 2023-12-30 NOTE — Assessment & Plan Note (Signed)
 Still not excited about cholesterol meds

## 2023-12-31 ENCOUNTER — Encounter: Payer: Medicare PPO | Admitting: Internal Medicine

## 2024-01-10 DIAGNOSIS — H524 Presbyopia: Secondary | ICD-10-CM | POA: Diagnosis not present

## 2024-01-10 DIAGNOSIS — H40022 Open angle with borderline findings, high risk, left eye: Secondary | ICD-10-CM | POA: Diagnosis not present

## 2024-01-10 DIAGNOSIS — H52223 Regular astigmatism, bilateral: Secondary | ICD-10-CM | POA: Diagnosis not present

## 2024-01-10 DIAGNOSIS — H5203 Hypermetropia, bilateral: Secondary | ICD-10-CM | POA: Diagnosis not present

## 2024-01-10 DIAGNOSIS — H25013 Cortical age-related cataract, bilateral: Secondary | ICD-10-CM | POA: Diagnosis not present

## 2024-01-10 DIAGNOSIS — H40012 Open angle with borderline findings, low risk, left eye: Secondary | ICD-10-CM | POA: Diagnosis not present

## 2024-01-10 DIAGNOSIS — H43811 Vitreous degeneration, right eye: Secondary | ICD-10-CM | POA: Diagnosis not present

## 2024-01-10 DIAGNOSIS — H16223 Keratoconjunctivitis sicca, not specified as Sjogren's, bilateral: Secondary | ICD-10-CM | POA: Diagnosis not present

## 2024-02-02 DIAGNOSIS — H40022 Open angle with borderline findings, high risk, left eye: Secondary | ICD-10-CM | POA: Diagnosis not present

## 2024-02-21 DIAGNOSIS — H40022 Open angle with borderline findings, high risk, left eye: Secondary | ICD-10-CM | POA: Diagnosis not present

## 2024-02-21 DIAGNOSIS — H16223 Keratoconjunctivitis sicca, not specified as Sjogren's, bilateral: Secondary | ICD-10-CM | POA: Diagnosis not present

## 2024-03-13 ENCOUNTER — Telehealth: Payer: Self-pay

## 2024-03-13 DIAGNOSIS — Z79899 Other long term (current) drug therapy: Secondary | ICD-10-CM

## 2024-03-13 NOTE — Telephone Encounter (Signed)
 Nelle Ban   03/13/2024  3:06 PM  Patient is returning a call he received from [facility], I asked patient if he wanted to go to a particular place for the bone density, but he is is open to going anywhere the situation was that when he called imaging they told him they would give him a call back to schedule the appointment but no one ever called. He said [facility] is close to him and he is open for Monday 03/20/2024 if we can assist with scheduling the appointment.

## 2024-03-13 NOTE — Telephone Encounter (Signed)
 Copied from CRM [id number]. Topic: Appointments - Scheduling Inquiry for Clinic >> Mar 13, 2024  1:43 PM Alexander Cohen wrote: Reason for CRM: Pt is calling to schedule for bone density scan pt can be reached at [phone]

## 2024-03-13 NOTE — Telephone Encounter (Signed)
 Left message to call office.  There is an order in his chart, but it was not to any particular place. Will see where he wants to go to have it done.

## 2024-03-14 NOTE — Telephone Encounter (Signed)
 Spoke to pt

## 2024-03-14 NOTE — Telephone Encounter (Signed)
 Alexander Cohen   03/14/2024 10:22 AM  Patient called to schedule his bone density, reached out to imaging and they informed me they were unable to assist with scheduling.

## 2024-03-14 NOTE — Addendum Note (Signed)
 Addended by: Curt Dover I on: 03/14/2024 07:29 AM   Modules accepted: Orders

## 2024-03-17 NOTE — Telephone Encounter (Signed)
 Left message on VM per DPR for pt to call me back so I can help him get scheduled. I have the information on how to do it.

## 2024-03-17 NOTE — Telephone Encounter (Signed)
 Pt called back and I got him scheduled for 03-20-24.

## 2024-03-20 ENCOUNTER — Ambulatory Visit (INDEPENDENT_AMBULATORY_CARE_PROVIDER_SITE_OTHER)
Admission: RE | Admit: 2024-03-20 | Discharge: 2024-03-20 | Disposition: A | Discharge status: Home / Self Care | Source: Ambulatory Visit | Attending: Internal Medicine | Admitting: Internal Medicine

## 2024-03-20 DIAGNOSIS — Z79899 Other long term (current) drug therapy: Secondary | ICD-10-CM | POA: Diagnosis not present

## 2024-03-25 ENCOUNTER — Ambulatory Visit: Payer: Self-pay | Admitting: Internal Medicine

## 2024-04-21 DIAGNOSIS — R300 Dysuria: Secondary | ICD-10-CM | POA: Diagnosis not present

## 2024-04-21 DIAGNOSIS — L239 Allergic contact dermatitis, unspecified cause: Secondary | ICD-10-CM | POA: Diagnosis not present

## 2024-05-23 DIAGNOSIS — T162XXA Foreign body in left ear, initial encounter: Secondary | ICD-10-CM | POA: Diagnosis not present

## 2024-05-23 DIAGNOSIS — H60312 Diffuse otitis externa, left ear: Secondary | ICD-10-CM | POA: Diagnosis not present

## 2024-05-24 ENCOUNTER — Ambulatory Visit: Payer: Self-pay

## 2024-05-24 NOTE — Telephone Encounter (Signed)
 Spoke to pt. He said he understands and will do.

## 2024-05-24 NOTE — Telephone Encounter (Signed)
 FYI Only or Action Required?: FYI only for provider.  Patient was last seen in primary care on 12/30/2023 by Jimmy Charlie FERNS, MD.  Called Nurse Triage reporting Palpitations.  Symptoms began yesterday.  Interventions attempted: Rest, hydration, or home remedies.  Symptoms are: completely resolved.  Triage Disposition: See HCP Within 4 Hours (Or PCP Triage)  Patient/caregiver understands and will follow disposition?: Yes  Appointment tomorrow    Copied from CRM [id number]. Topic: Clinical - Red Word Triage >> May 24, 2024 10:02 AM Alexander Cohen wrote: Red Word that prompted transfer to Nurse Triage: Patient had an Excelerated heart rate last night reaching 175, it is back down to 75-90 today, worried if it does go back up again    Reason for Disposition  [1] Heart beating very rapidly (e.g., > 140 / minute) AND [2] NOT present now  (Exception: Only during exercise.)    Elevated heart rate yesterday via apple watch, not symptomatic at that time, currently resolved. Appointment scheduled for tomorrow.  Answer Assessment - Initial Assessment Questions 1. DESCRIPTION: Please describe your heart rate or heartbeat that you are having (e.g., fast/slow, regular/irregular, skipped or extra beats, palpitations)     Heart was racing  2. ONSET: When did it start? (e.g., minutes, hours, days)      Yesterday  3. DURATION: How long does it last (e.g., seconds, minutes, hours)     2 hours  4. PATTERN Does it come and go, or has it been constant since it started?  Does it get worse with exertion?   Are you feeling it now?     Resolved at this time  6. HEART RATE: Can you tell me your heart rate? How many beats in 15 seconds?  Note: Not all patients can do this.       175 bpm yesterday, 75-90 today  7. RECURRENT SYMPTOM: Have you ever had this before? If Yes, ask: When was the last time? and What happened that time?      No 8. CAUSE: What do you think is causing the  palpitations?     Unsure  9. CARDIAC HISTORY: Do you have any history of heart disease? (e.g., heart attack, angina, bypass surgery, angioplasty, arrhythmia)      No 10. OTHER SYMPTOMS: Do you have any other symptoms? (e.g., dizziness, chest pain, sweating, difficulty breathing)       No  Protocols used: Heart Rate and Heartbeat Questions-A-AH

## 2024-05-25 ENCOUNTER — Ambulatory Visit: Admitting: Internal Medicine

## 2024-05-25 ENCOUNTER — Ambulatory Visit: Attending: Internal Medicine

## 2024-05-25 VITALS — BP 124/84 | HR 85 | Ht 69.75 in | Wt 210.0 lb

## 2024-05-25 DIAGNOSIS — M5431 Sciatica, right side: Secondary | ICD-10-CM

## 2024-05-25 DIAGNOSIS — M5430 Sciatica, unspecified side: Secondary | ICD-10-CM | POA: Insufficient documentation

## 2024-05-25 DIAGNOSIS — I471 Supraventricular tachycardia, unspecified: Secondary | ICD-10-CM | POA: Insufficient documentation

## 2024-05-25 DIAGNOSIS — R000 Tachycardia, unspecified: Secondary | ICD-10-CM | POA: Insufficient documentation

## 2024-05-25 MED ORDER — PREDNISONE 20 MG PO TABS: 40.0000 mg | ORAL_TABLET | Freq: Every day | ORAL | 0 refills | Status: DC

## 2024-05-25 NOTE — Assessment & Plan Note (Addendum)
 Worrisome for abnormal rhythm ---SVT or atrial fib EKG shows sinus at 71. New RBBB since EKG ~2003, but no ischemia or hypertrophy (now with Q in II---but they were in other inferior leads already in 2003)  Will check labs Zio monitor Discussed 911 for recurrence of rate over 150 ---consider cardiology evaluation

## 2024-05-25 NOTE — Progress Notes (Signed)
 Subjective:    Patient ID: Alexander Cohen, male    DOB: 1956-02-25, 68 y.o.   MRN: [id number]  HPI Here due to elevated heart rate noted on watch  Just didn't feel right 2 days ago in gym doing his regular routine Could feel his heart rate going way up Heart rate on bicycle 169----and watch showed into 170's (max usually 130 with exercise) Stayed 150-170 for about 2 hours--even just sitting around Then down to 120's and finally normal again by yesterday  No chest pressure then--but has had slight tightness yesterday and today. Thinks it cold be related to anxiety about this Highest 120's yesterday Did take 1/2 mile walk this morning--heart rate under 110 after that  No SOB No sweating with these spells--but might have had slight nausea Has tried to make sure he is hydrating  Has had recurrence of right sciatica Intermittent Using heat and prn ibuprofen Also using massager  Current Outpatient Medications on File Prior to Visit  Medication Sig Dispense Refill   EPINEPHrine  (EPIPEN  2-PAK) 0.3 mg/0.3 mL IJ SOAJ injection INJECT 1 PEN SUBCUTANEOUSLY AS DIRECTED 1 each 1   predniSONE  (DELTASONE ) 5 MG tablet Take 1 tablet (5 mg total) by mouth daily. Or as directed 90 tablet 3   No current facility-administered medications on file prior to visit.    Allergies  Allergen Reactions   Other Anaphylaxis    Pine Nuts    Past Medical History:  Diagnosis Date   ADHD (attention deficit hyperactivity disorder), inattentive type    Anxiety    Hyperlipidemia    Nasal polyp    Osteoarthritis     Past Surgical History:  Procedure Laterality Date   APPENDECTOMY  1964   COLONOSCOPY  2017   DJ-MAC-suprep(exc)-polyps   INGUINAL HERNIA REPAIR Left 09/03/1999   (Dr. Toribio)   KNEE CARTILAGE SURGERY  11/03/1971   Left   KNEE SURGERY Right 1974    Family History  Problem Relation Age of Onset   Alcohol abuse Mother    Alcohol abuse Father    Colon polyps Sister 59    Diabetes Maternal Grandmother    Colon cancer Neg Hx    Esophageal cancer Neg Hx    Prostate cancer Neg Hx    Stomach cancer Neg Hx    Rectal cancer Neg Hx     Social History   Socioeconomic History   Marital status: Married    Spouse name: Not on file   Number of children: 3   Years of education: Not on file   Highest education level: Master's degree (e.g., MA, MS, MEng, MEd, MSW, MBA)  Occupational History   Occupation: Principal---retired    Associate Professor: Advice worker [employer]    Comment: Engineer, petroleum     Comment:    Tobacco Use   Smoking status: Never    Passive exposure: Past   Smokeless tobacco: Never  Vaping Use   Vaping status: Never Used  Substance and Sexual Activity   Alcohol use: Not Currently    Alcohol/week: 0.0 - 7.0 standard drinks of alcohol   Drug use: No   Sexual activity: Not on file  Other Topics Concern   Not on file  Social History Narrative   Has living will   Wife, then son Norleen, is health care POA   Would accept resuscitation   No tube feeds if cognitively unaware   Social Drivers of Health   Financial Resource Strain: Low Risk  (05/25/2024)   Overall Financial  Resource Strain (CARDIA)    Difficulty of Paying Living Expenses: Not hard at all  Food Insecurity: No Food Insecurity (05/25/2024)   Hunger Vital Sign    Worried About Running Out of Food in the Last Year: Never true    Ran Out of Food in the Last Year: Never true  Transportation Needs: No Transportation Needs (05/25/2024)   PRAPARE - Administrator, Civil Service (Medical): No    Lack of Transportation (Non-Medical): No  Physical Activity: Sufficiently Active (05/25/2024)   Exercise Vital Sign    Days of Exercise per Week: 6 days    Minutes of Exercise per Session: 60 min  Stress: No Stress Concern Present (05/25/2024)   [employer] of Occupational Health - Occupational Stress Questionnaire    Feeling of Stress: Only a little  Social Connections: Socially  Integrated (05/25/2024)   Social Connection and Isolation Panel    Frequency of Communication with Friends and Family: Twice a week    Frequency of Social Gatherings with Friends and Family: Once a week    Attends Religious Services: More than 4 times per year    Active Member of [employer] or Organizations: Yes    Attends Engineer, structural: More than 4 times per year    Marital Status: Married  Catering manager Violence: Not on file   Review of Systems Eating okay No change in exercise tolerance Still struggles with sleep at times---occasionally takes short nap in afternoon     Objective:   Physical Exam Constitutional:      Appearance: Normal appearance.  Cardiovascular:     Rate and Rhythm: Normal rate and regular rhythm.     Pulses: Normal pulses.     Heart sounds: No murmur heard.    No gallop.  Pulmonary:     Effort: Pulmonary effort is normal.     Breath sounds: Normal breath sounds. No wheezing or rales.  Abdominal:     Palpations: Abdomen is soft.     Tenderness: There is no abdominal tenderness.  Musculoskeletal:     Cervical back: Neck supple.     Right lower leg: No edema.     Left lower leg: No edema.  Lymphadenopathy:     Cervical: No cervical adenopathy.  Neurological:     Mental Status: He is alert.            Assessment & Plan:

## 2024-05-25 NOTE — Assessment & Plan Note (Signed)
 Discussed that it can often be self limited Will give short burst of prednisone  --- 40 x 3 then 20mg  x 3

## 2024-05-26 ENCOUNTER — Ambulatory Visit: Payer: Self-pay | Admitting: Internal Medicine

## 2024-05-26 LAB — COMPREHENSIVE METABOLIC PANEL WITH GFR
ALT: 28 U/L (ref 0–53)
AST: 22 U/L (ref 0–37)
Albumin: 3.8 g/dL (ref 3.5–5.2)
Alkaline Phosphatase: 43 U/L (ref 39–117)
BUN: 25 mg/dL — ABNORMAL HIGH (ref 6–23)
CO2: 25 meq/L (ref 19–32)
Calcium: 9 mg/dL (ref 8.4–10.5)
Chloride: 108 meq/L (ref 96–112)
Creatinine, Ser: 1.05 mg/dL (ref 0.40–1.50)
GFR: 72.01 mL/min (ref >60.00)
Glucose, Bld: 92 mg/dL (ref 70–99)
Potassium: 4.2 meq/L (ref 3.5–5.1)
Sodium: 140 meq/L (ref 135–145)
Total Bilirubin: 0.5 mg/dL (ref 0.2–1.2)
Total Protein: 6.2 g/dL (ref 6.0–8.3)

## 2024-05-26 LAB — CBC
HCT: 45.6 % (ref 39.0–52.0)
Hemoglobin: 15.1 g/dL (ref 13.0–17.0)
MCHC: 33.1 g/dL (ref 30.0–36.0)
MCV: 92 fl (ref 78.0–100.0)
Platelets: 233 K/uL (ref 150.0–400.0)
RBC: 4.96 Mil/uL (ref 4.22–5.81)
RDW: 13.2 % (ref 11.5–15.5)
WBC: 6.6 K/uL (ref 4.0–10.5)

## 2024-05-26 LAB — TSH: TSH: 1.83 u[IU]/mL (ref 0.35–5.50)

## 2024-05-29 ENCOUNTER — Telehealth: Payer: Self-pay | Admitting: Internal Medicine

## 2024-05-29 NOTE — Telephone Encounter (Signed)
 Lm om asking pt to callback to sch with a provider will offerred pt with provider who is accepting TOC patients

## 2024-05-29 NOTE — Telephone Encounter (Signed)
-----   Message from Meadowood L sent at 05/29/2024 11:17 AM EDT ----- Regarding: Establish Care Thank you Amy, sure can.   [name] can you please reach out to pt to get them established with one of our providers? ----- Message ----- From: Katrina No Sent: 05/25/2024   3:02 PM EDT To: [place] LITTIE Fonder  Hi Candice- Please see messsage from Dr. Jimmy below. Can someone from your office reach out to this patient to get them established at [facility]? Dr. Jimmy is retiring and wants the patient to establish there.  Please advise.  Thank you  Amy ----- Message ----- From: Jimmy Charlie FERNS, MD Sent: 05/25/2024   2:48 PM EDT To: Ezzard Correne Beagle Admin  Please try to get him in  with PCP with [facility] --needs PE in February or March

## 2024-05-31 NOTE — Telephone Encounter (Signed)
 Pt is sch with dr twana for 09-12-2024

## 2024-06-06 ENCOUNTER — Other Ambulatory Visit: Payer: Self-pay | Admitting: Internal Medicine

## 2024-06-06 DIAGNOSIS — R000 Tachycardia, unspecified: Secondary | ICD-10-CM

## 2024-06-16 DIAGNOSIS — R000 Tachycardia, unspecified: Secondary | ICD-10-CM | POA: Diagnosis not present

## 2024-06-19 ENCOUNTER — Telehealth: Payer: Self-pay

## 2024-06-19 NOTE — Telephone Encounter (Signed)
 Received ZIO  report  VIA fax and gave to Holley Dawn RN

## 2024-06-19 NOTE — Telephone Encounter (Signed)
 Zio report received with MD alert notification for 7 beat VTach and criteria met for SVT. Monitor ordered by Dr. Jimmy and report assigned to Dr. Cindie for him to read. Zio report showed to DOD and will route note to both DOD and Dr. Cindie.

## 2024-06-21 DIAGNOSIS — R000 Tachycardia, unspecified: Secondary | ICD-10-CM

## 2024-07-04 NOTE — Progress Notes (Signed)
" °  Electrophysiology Office Note:    Date:  07/05/2024   ID:  Alexander Cohen, DOB 20-Nov-1955, MRN [id number]  PCP:  Jimmy Charlie FERNS, MD   [facility] Providers Cardiologist:  None     Referring MD: Jimmy Charlie FERNS, MD   History of Present Illness:    Alexander Cohen is a 68 y.o. male without significant medical history, referred for management of SVT.      Discussed the use of AI scribe software for clinical note transcription with the patient, who gave verbal consent to proceed.  History of Present Illness Alexander Cohen is a 68 year old male who presents with episodes of arrhythmia.  He has experienced arrhythmia episodes for one year, with increased frequency over the past six to eight weeks. Episodes have become more frequent from late July to August.  Vagal maneuvers such as immersing his face in cold water, coughing, and performing a Valsalva maneuver have been ineffective. Carotid massage provides the most relief.  There is no chest pain or shortness of breath during episodes.         Today, he reports that he feels well and has no complaints.  EKGs/Labs/Other Studies Reviewed Today:     Echocardiogram:  TTE ordered   Monitors:  14 day monitor July 2025-- my interpretation Sinus rhythm predominates, HR 48-117 bpm, avg 80 bpm Multiple episodes of SVT occurred Symptoms were associated with SVT and with atrial and ventricular ectopy    EKG:   EKG Interpretation Date/Time:  Wednesday July 05 2024 09:34:18 EDT Ventricular Rate:  76 PR Interval:  160 QRS Duration:  136 QT Interval:  420 QTC Calculation: 472 R Axis:   -21  Text Interpretation: Normal sinus rhythm Right bundle branch block Inferior infarct , age undetermined No previous ECGs available Confirmed by Nancey Scotts [phone]) on 07/05/2024 9:55:02 AM     Physical Exam:    VS:  BP (!) 130/90 (BP Location: Right Arm, Patient Position: Sitting, Cuff Size: Normal)    Pulse 75   Ht 5' 9.75 (1.772 m)   Wt 210 lb (95.3 kg)   SpO2 95%   BMI 30.35 kg/m     Wt Readings from Last 3 Encounters:  07/05/24 210 lb (95.3 kg)  05/25/24 210 lb (95.3 kg)  12/30/23 209 lb (94.8 kg)     GEN: Well nourished, well developed in no acute distress CARDIAC: RRR, no murmurs, rubs, gallops RESPIRATORY:  Normal work of breathing MUSCULOSKELETAL: no edema    ASSESSMENT & PLAN:     SVT Symptomatic with palpitations Narrow complex tachycardia with a rate of about 160 bpm documented on July monitor We discussed SVT in detail, prognosis, management options, and using a shared decision making approach decided to proceed with electrophysiology study and possible ablation. Will plan to use the Carto system  We discussed the indication, rationale, logistics, anticipated benefits, and potential risks of the ablation procedure including but not limited to -- bleed at the groin access site, chest pain, damage to nearby organs such as the diaphragm, lungs, or esophagus, need for a drainage tube, or prolonged hospitalization. I explained that the risk for stroke, heart attack, need for open chest surgery, or even death is very low but not zero. he  expressed understanding and wishes to proceed.      Signed, Scotts FORBES Nancey, MD  07/05/2024 2:18 PM    [facility] "

## 2024-07-05 ENCOUNTER — Encounter: Payer: Self-pay | Admitting: Cardiovascular Disease

## 2024-07-05 ENCOUNTER — Ambulatory Visit: Attending: Cardiovascular Disease | Admitting: Cardiovascular Disease

## 2024-07-05 VITALS — BP 130/90 | HR 75 | Ht 69.75 in | Wt 210.0 lb

## 2024-07-05 DIAGNOSIS — I471 Supraventricular tachycardia, unspecified: Secondary | ICD-10-CM | POA: Diagnosis not present

## 2024-07-05 DIAGNOSIS — R000 Tachycardia, unspecified: Secondary | ICD-10-CM

## 2024-07-05 MED ORDER — METOPROLOL TARTRATE 25 MG PO TABS: 25.0000 mg | ORAL_TABLET | Freq: Two times a day (BID) | ORAL | 0 refills | Status: DC | PRN

## 2024-07-05 NOTE — Patient Instructions (Signed)
 Medication Instructions:  Your physician has recommended you make the following change in your medication:   ** Begin Metoprolol  Tartrate 25mg  - 1 tablet by mouth twice daily as needed for elevated heart rate.  *If you need a refill on your cardiac medications before your next appointment, please call your pharmacy*  Lab Work: CBC and BMET - please have pre-procedure lab work completed on Wednesday, 08/23/24 . This can be done at ANY [facility] near you - no appointment required and this does not have to be fasting. If you have labs (blood work) drawn today and your tests are completely normal, you will receive your results only by: MyChart Message (if you have MyChart) OR A paper copy in the mail If you have any lab test that is abnormal or we need to change your treatment, we will call you to review the results.  Testing/Procedures: Your physician has requested that you have an echocardiogram. Echocardiography is a painless test that uses sound waves to create images of your heart. It provides your doctor with information about the size and shape of your heart and how well your heart's chambers and valves are working. This procedure takes approximately one hour. There are no restrictions for this procedure. Please do NOT wear cologne, perfume, aftershave, or lotions (deodorant is allowed). Please arrive 15 minutes prior to your appointment time.  Please note: We ask at that you not bring children with you during ultrasound (echo/ vascular) testing. Due to room size and safety concerns, children are not allowed in the ultrasound rooms during exams. Our front office staff cannot provide observation of children in our lobby area while testing is being conducted. An adult accompanying a patient to their appointment will only be allowed in the ultrasound room at the discretion of the ultrasound technician under special circumstances. We apologize for any inconvenience.    Atrial Fibrillation Ablation  - scheduled on Tuesday 09/05/2024 We will be in contact closer to your ablation date with further instructions Your physician has recommended that you have an ablation. Catheter ablation is a medical procedure used to treat some cardiac arrhythmias (irregular heartbeats). During catheter ablation, a long, thin, flexible tube is put into a blood vessel in your groin (upper thigh), or neck. This tube is called an ablation catheter. It is then guided to your heart through the blood vessel. Radio frequency waves destroy small areas of heart tissue where abnormal heartbeats may cause an arrhythmia to start. Please see the instruction sheet given to you today.  Follow-Up: At [facility], you and your health needs are our priority.  As part of our continuing mission to provide you with exceptional heart care, our providers are all part of one team.  This team includes your primary Cardiologist (physician) and Advanced Practice Providers or APPs (Physician Assistants and Nurse Practitioners) who all work together to provide you with the care you need, when you need it.  Your next appointment:   We will schedule follow up after your ablation  Provider:   Mealor   Cardiac Ablation Cardiac ablation is a procedure to destroy, or ablate, a small amount of heart tissue that is causing problems. The heart has many electrical connections. Sometimes, these connections are abnormal and can cause the heart to beat very fast or irregularly. Ablating the abnormal areas can improve the heart's rhythm or return it to normal. Ablation may be done for people who: Have irregular or rapid heartbeats (arrhythmias). Have Wolff-Parkinson-White syndrome. Have taken medicines for  an arrhythmia that did not work or caused side effects. Have a high-risk heartbeat that may be life-threatening. Tell a health care provider about: Any allergies you have. All medicines you are taking, including vitamins, herbs, eye drops,  creams, and over-the-counter medicines. Any problems you or family members have had with anesthesia. Any bleeding problems you have. Any surgeries you have had. Any medical conditions you have. Whether you are pregnant or may be pregnant. What are the risks? Your health care provider will talk with you about risks. These may include: Infection. Bruising and bleeding. Stroke or blood clots. Damage to nearby structures or organs. Allergic reaction to medicines or dyes. Needing a pacemaker if the heart gets damaged. A pacemaker is a device that helps the heart beat normally. Failure of the procedure. A repeat procedure may be needed. What happens before the procedure? Medicines Ask your health care provider about: Changing or stopping your regular medicines. These include any heart rhythm medicines, diabetes medicines, or blood thinners you take. Taking medicines such as aspirin and ibuprofen. These medicines can thin your blood. Do not take them unless your health care provider tells you to. Taking over-the-counter medicines, vitamins, herbs, and supplements. General instructions Follow instructions from your health care provider about what you may eat and drink. If you will be going home right after the procedure, plan to have a responsible adult: Take you home from the hospital or clinic. You will not be allowed to drive. Care for you for the time you are told. Ask your health care provider what steps will be taken to prevent infection. What happens during the procedure?  An IV will be inserted into one of your veins. You may be given: A sedative. This helps you relax. Anesthesia. This will: Numb certain areas of your body. An incision will be made in your neck or your groin. A needle will be inserted through the incision and into a large vein in your neck or groin. The small, thin tube (catheter) will be inserted through the needle and moved to your heart. A type of X-ray  (fluoroscopy) will be used to help guide the catheter and provide images of the heart on a monitor. Dye may be injected through the catheter to help your surgeon see the area of the heart that needs treatment. Electrical currents will be sent from the catheter to destroy heart tissue in certain areas. There are three types of energy that may be used to do this: Heat (radiofrequency energy). Laser energy. Extreme cold (cryoablation). When the tissue has been destroyed, the catheter will be removed. Pressure will be held on the insertion area to prevent bleeding. A bandage (dressing) will be placed over the insertion area. The procedure may vary among health care providers and hospitals. What happens after the procedure? Your blood pressure, heart rate and rhythm, breathing rate, and blood oxygen level will be monitored until you leave the hospital or clinic. Your insertion area will be checked for bleeding. You will need to lie still for a few hours. If your groin was used, you will need to keep your leg straight for a few hours after the catheter is removed. This information is not intended to replace advice given to you by your health care provider. Make sure you discuss any questions you have with your health care provider. Document Revised: 04/07/2022 Document Reviewed: 04/07/2022 Elsevier Patient Education  2024 [employer].

## 2024-07-18 ENCOUNTER — Other Ambulatory Visit: Payer: Self-pay | Admitting: Cardiovascular Disease

## 2024-07-25 ENCOUNTER — Ambulatory Visit: Admitting: Student in an Organized Health Care Education/Training Program

## 2024-08-04 ENCOUNTER — Telehealth: Payer: Self-pay

## 2024-08-04 NOTE — Telephone Encounter (Signed)
Work up complete. 

## 2024-08-04 NOTE — Telephone Encounter (Signed)
-----   Message from Nurse Aldona R sent at 07/05/2024  1:46 PM EDT ----- Regarding: SVT 09/05/24 at 1230 Important: list procedure date as first item in subject line, followed by procedure type (e.g., 07/14/24 AFib ablation)  Precert:  MD: Mealor Type of ablation: SVT Diagnosis: SVT CPT code: SVT/WPW (06346) Ablation scheduled (date/time): 11/4 - 1230  Procedure:SVT ablation  Added to calendar? Yes Orders entered? No, >30 days before procedure Letter complete? No, >30 days before procedure Scheduled with cath lab? Yes Any medications to hold? Yes (please list hold instructions): Metoprolol  x 2days Labs ordered (CBC, BMET, PT/INR if on warfarin): Yes Mapping system: CARTO (lab 4 or 6) CARTO/OPAL rep notified? Yes Cardiac CT needed? No Dye allergy? No Pre-meds ordered and instructions given? N/A Letter method: MyChart H&P: 9/3 Device: No  Follow-up:  Cassie/Angel, please schedule Routine.  Covering RN - please send this message to [employer], EP scheduler, EP Scheduling pool, EP [employer], and CT scheduler ([place] Lynch/Stephanie Mogg), if indicated.

## 2024-08-11 LAB — CBC

## 2024-08-12 LAB — BASIC METABOLIC PANEL WITH GFR
BUN/Creatinine Ratio: 22 (ref 10–24)
BUN: 23 mg/dL (ref 8–27)
CO2: 21 mmol/L (ref 20–29)
Calcium: 9.4 mg/dL (ref 8.6–10.2)
Chloride: 105 mmol/L (ref 96–106)
Creatinine, Ser: 1.03 mg/dL (ref 0.76–1.27)
Glucose: 77 mg/dL (ref 70–99)
Potassium: 4.5 mmol/L (ref 3.5–5.2)
Sodium: 141 mmol/L (ref 134–144)
eGFR: 78 mL/min/1.73 (ref >59)

## 2024-08-12 LAB — CBC
Hematocrit: 47.1 % (ref 37.5–51.0)
Hemoglobin: 15.9 g/dL (ref 13.0–17.7)
MCH: 31.7 pg (ref 26.6–33.0)
MCHC: 33.8 g/dL (ref 31.5–35.7)
MCV: 94 fL (ref 79–97)
Platelets: 257 x10E3/uL (ref 150–450)
RBC: 5.01 x10E6/uL (ref 4.14–5.80)
RDW: 11.9 % (ref 11.6–15.4)
WBC: 8.8 x10E3/uL (ref 3.4–10.8)

## 2024-08-14 ENCOUNTER — Ambulatory Visit: Payer: Self-pay | Admitting: Cardiovascular Disease

## 2024-08-15 ENCOUNTER — Telehealth (HOSPITAL_COMMUNITY): Payer: Self-pay

## 2024-08-15 ENCOUNTER — Encounter (HOSPITAL_COMMUNITY): Payer: Self-pay

## 2024-08-15 NOTE — Telephone Encounter (Signed)
 Attempted to reach patient to discuss upcoming procedure, no answer. Left VM for patient to return call.

## 2024-08-15 NOTE — Telephone Encounter (Signed)
 Spoke with patient to complete pre-procedure call.     Health status review:  Any new medical conditions, recent signs of acute illness or been started on antibiotics? No Any recent hospitalizations or surgeries? No Any new medications started since pre-op visit? No  Follow all medication instructions prior to procedure or the procedure may be rescheduled:    HOLD: Metoprolol  for 2 days prior to your procedure. Last dose will be on Saturday, November 1. Essential chronic medications:  Patient states he will wait to take Prednisone  the following day.  On the morning of your procedure DO NOT take any medication.  Nothing to eat or drink after midnight prior to your procedure.  Pre-procedure testing scheduled: lab work completed 10/10.  Confirmed patient is scheduled for SVT Ablation on Tuesday, November 4 with Dr. Dr. Nancey. Instructed patient to arrive at the Main Entrance A at [facility]: [address] [place], [place] [address] and check in at Admitting at 10:30 AM.  Advised of plan to go home the same day and will only stay overnight if medically necessary. You MUST have a responsible adult to drive you home and MUST be with you the first 24 hours after you arrive home or your procedure could be cancelled.  Informed patient a nurse will call a day before the procedure to confirm arrival time and ensure instructions are followed.  Patient verbalized understanding to information provided and is agreeable to proceed with procedure.   Advised patient to contact RN Navigator at [phone], to inform of any new medications started after call or concerns prior to procedure.

## 2024-08-23 ENCOUNTER — Ambulatory Visit (HOSPITAL_COMMUNITY)
Admission: RE | Admit: 2024-08-23 | Discharge: 2024-08-23 | Disposition: A | Discharge status: Home / Self Care | Source: Ambulatory Visit | Attending: Cardiology | Admitting: Cardiology

## 2024-08-23 DIAGNOSIS — I071 Rheumatic tricuspid insufficiency: Secondary | ICD-10-CM | POA: Diagnosis not present

## 2024-08-23 DIAGNOSIS — I471 Supraventricular tachycardia, unspecified: Secondary | ICD-10-CM | POA: Insufficient documentation

## 2024-08-23 DIAGNOSIS — I499 Cardiac arrhythmia, unspecified: Secondary | ICD-10-CM | POA: Diagnosis not present

## 2024-08-23 DIAGNOSIS — I4510 Unspecified right bundle-branch block: Secondary | ICD-10-CM | POA: Insufficient documentation

## 2024-08-23 DIAGNOSIS — I77810 Thoracic aortic ectasia: Secondary | ICD-10-CM | POA: Insufficient documentation

## 2024-08-23 DIAGNOSIS — R000 Tachycardia, unspecified: Secondary | ICD-10-CM

## 2024-08-23 LAB — ECHOCARDIOGRAM COMPLETE
Area-P 1/2: 2.59 cm2
P 1/2 time: 349 ms
S' Lateral: 3.6 cm

## 2024-09-04 NOTE — Pre-Procedure Instructions (Signed)
 Attempted to call patient regarding procedure instructions.  Left voicemail on the following items: Arrival time 0930- new arrival time Nothing to eat or drink after midnight No meds AM of procedure Responsible person to drive you home and stay with you for 24 hrs

## 2024-09-05 ENCOUNTER — Ambulatory Visit (HOSPITAL_COMMUNITY)
Admission: RE | Admit: 2024-09-05 | Discharge: 2024-09-05 | Disposition: A | Discharge status: Home / Self Care | Attending: Cardiovascular Disease | Admitting: Cardiovascular Disease

## 2024-09-05 ENCOUNTER — Encounter (HOSPITAL_COMMUNITY): Payer: Self-pay | Admitting: Certified Registered Nurse Anesthetist

## 2024-09-05 ENCOUNTER — Other Ambulatory Visit: Payer: Self-pay

## 2024-09-05 ENCOUNTER — Ambulatory Visit (HOSPITAL_COMMUNITY): Payer: Self-pay | Admitting: Certified Registered Nurse Anesthetist

## 2024-09-05 ENCOUNTER — Ambulatory Visit (HOSPITAL_COMMUNITY)
Admission: RE | Disposition: A | Discharge status: Home / Self Care | Payer: Self-pay | Source: Home / Self Care | Attending: Cardiovascular Disease

## 2024-09-05 DIAGNOSIS — F419 Anxiety disorder, unspecified: Secondary | ICD-10-CM | POA: Diagnosis not present

## 2024-09-05 DIAGNOSIS — I4719 Other supraventricular tachycardia: Secondary | ICD-10-CM

## 2024-09-05 DIAGNOSIS — I471 Supraventricular tachycardia, unspecified: Secondary | ICD-10-CM

## 2024-09-05 HISTORY — PX: SVT ABLATION: EP1225

## 2024-09-05 SURGERY — SVT ABLATION
Anesthesia: Monitor Anesthesia Care

## 2024-09-05 MED ORDER — SODIUM CHLORIDE 0.9 % IV SOLN: INTRAVENOUS | Status: DC

## 2024-09-05 MED ORDER — SODIUM CHLORIDE 0.9% FLUSH
3.0000 mL | INTRAVENOUS | Status: DC | PRN
Start: 2024-09-05 — End: 2024-09-05

## 2024-09-05 MED ORDER — FENTANYL CITRATE (PF) 100 MCG/2ML IJ SOLN
INTRAMUSCULAR | Status: AC
  Filled 2024-09-05: qty 2

## 2024-09-05 MED ORDER — ACETAMINOPHEN 325 MG PO TABS: 650.0000 mg | ORAL_TABLET | ORAL | Status: DC | PRN

## 2024-09-05 MED ORDER — FENTANYL CITRATE (PF) 100 MCG/2ML IJ SOLN
INTRAMUSCULAR | Status: DC | PRN
  Administered 2024-09-05 (×2): 25 ug via INTRAVENOUS
  Administered 2024-09-05: 50 ug via INTRAVENOUS
  Administered 2024-09-05 (×3): 25 ug via INTRAVENOUS
  Administered 2024-09-05: 50 ug via INTRAVENOUS

## 2024-09-05 MED ORDER — ISOPROTERENOL HCL 0.2 MG/ML IJ SOLN
INTRAVENOUS | Status: DC | PRN
  Administered 2024-09-05: 2 ug/min via INTRAVENOUS

## 2024-09-05 MED ORDER — BUPIVACAINE HCL (PF) 0.25 % IJ SOLN
INTRAMUSCULAR | Status: AC
  Filled 2024-09-05: qty 30

## 2024-09-05 MED ORDER — HEPARIN SODIUM (PORCINE) 1000 UNIT/ML IJ SOLN
INTRAMUSCULAR | Status: DC | PRN
  Administered 2024-09-05: 1000 [IU] via INTRAVENOUS

## 2024-09-05 MED ORDER — ACETAMINOPHEN 500 MG PO TABS
1000.0000 mg | ORAL_TABLET | Freq: Once | ORAL | Status: AC
  Administered 2024-09-05: 1000 mg via ORAL
  Filled 2024-09-05: qty 2

## 2024-09-05 MED ORDER — SODIUM CHLORIDE 0.9% FLUSH: 3.0000 mL | Freq: Two times a day (BID) | INTRAVENOUS | Status: DC

## 2024-09-05 MED ORDER — MIDAZOLAM HCL 2 MG/2ML IJ SOLN
INTRAMUSCULAR | Status: AC
  Filled 2024-09-05: qty 2

## 2024-09-05 MED ORDER — HEPARIN SODIUM (PORCINE) 1000 UNIT/ML IJ SOLN
INTRAMUSCULAR | Status: DC | PRN
  Administered 2024-09-05: 4000 [IU] via INTRAVENOUS

## 2024-09-05 MED ORDER — ONDANSETRON HCL 4 MG/2ML IJ SOLN
INTRAMUSCULAR | Status: DC | PRN
  Administered 2024-09-05: 4 mg via INTRAVENOUS

## 2024-09-05 MED ORDER — MIDAZOLAM HCL (PF) 2 MG/2ML IJ SOLN
INTRAMUSCULAR | Status: DC | PRN
Start: 2024-09-05 — End: 2024-09-05
  Administered 2024-09-05 (×2): 1 mg via INTRAVENOUS

## 2024-09-05 MED ORDER — FENTANYL CITRATE (PF) 100 MCG/2ML IJ SOLN: 25.0000 ug | INTRAMUSCULAR | Status: DC | PRN

## 2024-09-05 MED ORDER — ONDANSETRON HCL 4 MG/2ML IJ SOLN: 4.0000 mg | Freq: Once | INTRAMUSCULAR | Status: DC | PRN

## 2024-09-05 MED ORDER — HEPARIN (PORCINE) IN NACL 1000-0.9 UT/500ML-% IV SOLN
INTRAVENOUS | Status: DC | PRN
  Administered 2024-09-05 (×2): 500 mL

## 2024-09-05 MED ORDER — ONDANSETRON HCL 4 MG/2ML IJ SOLN: 4.0000 mg | Freq: Four times a day (QID) | INTRAMUSCULAR | Status: DC | PRN

## 2024-09-05 MED ORDER — ISOPROTERENOL HCL 0.2 MG/ML IJ SOLN
INTRAMUSCULAR | Status: AC
  Filled 2024-09-05: qty 5

## 2024-09-05 MED ORDER — HEPARIN SODIUM (PORCINE) 1000 UNIT/ML IJ SOLN
INTRAMUSCULAR | Status: AC
  Filled 2024-09-05: qty 10

## 2024-09-05 MED ORDER — SODIUM CHLORIDE 0.9 % IV SOLN: 250.0000 mL | INTRAVENOUS | Status: DC | PRN

## 2024-09-05 SURGICAL SUPPLY — 11 items
CATH ABLAT QDOT MICRO BI TC DF (CATHETERS) IMPLANT
CATH DECANAV D CURVE (CATHETERS) IMPLANT
CATH JOSEPH QUAD ALLRED 6F REP (CATHETERS) IMPLANT
DEVICE CLOSURE MYNXGRIP 6/7F (Vascular Products) IMPLANT
MAT PREVALON FULL STRYKER (MISCELLANEOUS) IMPLANT
PACK EP LF (CUSTOM PROCEDURE TRAY) ×2 IMPLANT
PAD DEFIB RADIO PHYSIO CONN (PAD) ×2 IMPLANT
PATCH CARTO3 (PAD) IMPLANT
SHEATH PINNACLE 7F 10CM (SHEATH) IMPLANT
SHEATH PINNACLE 8F 10CM (SHEATH) IMPLANT
TUBING SMART ABLATE COOLFLOW (TUBING) IMPLANT

## 2024-09-05 NOTE — H&P (Signed)
  Electrophysiology Office Note:    Date:  09/05/2024   ID:  Alexander Cohen, DOB 10-25-1956, MRN [id number]  PCP:  Jordan, Betty G, MD   [facility] Providers Cardiologist:  None     Referring MD: No ref. provider found   History of Present Illness:    Alexander Cohen is a 68 y.o. male without significant medical history, referred for management of SVT.      Discussed the use of AI scribe software for clinical note transcription with the patient, who gave verbal consent to proceed.  History of Present Illness Alexander Cohen is a 68 year old male who presents with episodes of arrhythmia.  He has experienced arrhythmia episodes for one year, with increased frequency over the past six to eight weeks. Episodes have become more frequent from late July to August.  Vagal maneuvers such as immersing his face in cold water, coughing, and performing a Valsalva maneuver have been ineffective. Carotid massage provides the most relief.  There is no chest pain or shortness of breath during episodes.         Today, he reports that he feels well and has no complaints. He presents today for EP study and potential ablation of SVT. No changes in medications, diagnoses, or his condition since September.  EKGs/Labs/Other Studies Reviewed Today:     Echocardiogram:  TTE ordered   Monitors:  14 day monitor July 2025-- my interpretation Sinus rhythm predominates, HR 48-117 bpm, avg 80 bpm Multiple episodes of SVT occurred Symptoms were associated with SVT and with atrial and ventricular ectopy    EKG:         Physical Exam:    VS:  BP (!) 149/90   Pulse 79   Temp 97.6 F (36.4 C) (Oral)   Resp 17   Ht 5' 10 (1.778 m)   Wt 95.3 kg   SpO2 97%   BMI 30.13 kg/m     Wt Readings from Last 3 Encounters:  09/05/24 95.3 kg  07/05/24 95.3 kg  05/25/24 95.3 kg     GEN: Well nourished, well developed in no acute distress CARDIAC: RRR, no murmurs, rubs,  gallops RESPIRATORY:  Normal work of breathing MUSCULOSKELETAL: no edema    ASSESSMENT & PLAN:     SVT Symptomatic with palpitations Narrow complex tachycardia with a rate of about 160 bpm documented on July monitor We discussed SVT in detail, prognosis, management options, and using a shared decision making approach decided to proceed with electrophysiology study and possible ablation. Will plan to use the Carto system  We discussed the indication, rationale, logistics, anticipated benefits, and potential risks of the ablation procedure including but not limited to -- bleed at the groin access site, chest pain, damage to nearby organs such as the diaphragm, lungs, or esophagus, need for a drainage tube, or prolonged hospitalization. I explained that the risk for stroke, heart attack, need for open chest surgery, or even death is very low but not zero. he  expressed understanding and wishes to proceed.      Signed, Eulas FORBES Furbish, MD  09/05/2024 11:26 AM    [facility]

## 2024-09-05 NOTE — Discharge Instructions (Addendum)

## 2024-09-05 NOTE — Anesthesia Preprocedure Evaluation (Addendum)
 Anesthesia Evaluation  Patient identified by MRN, date of birth, ID band Patient awake  General Assessment Comment:Last metoprolol  use was 10/31  Reviewed: Allergy & Precautions, NPO status , Patient's Chart, lab work & pertinent test results, reviewed documented beta blocker date and time   Airway Mallampati: II  TM Distance: >3 FB Neck ROM: Full    Dental  (+) Teeth Intact, Dental Advisory Given   Pulmonary neg pulmonary ROS   Pulmonary exam normal breath sounds clear to auscultation       Cardiovascular Pt. on home beta blockers (-) angina (-) Past MI Normal cardiovascular exam+ dysrhythmias Supra Ventricular Tachycardia  Rhythm:Regular Rate:Normal     Neuro/Psych  PSYCHIATRIC DISORDERS Anxiety     negative neurological ROS     GI/Hepatic negative GI ROS, Neg liver ROS,,,  Endo/Other  Obesity   Renal/GU negative Renal ROS     Musculoskeletal  (+) Arthritis ,    Abdominal   Peds  (+) ADHD Hematology negative hematology ROS (+)   Anesthesia Other Findings Day of surgery medications reviewed with the patient.  Reproductive/Obstetrics                              Anesthesia Physical Anesthesia Plan  ASA: 3  Anesthesia Plan: MAC   Post-op Pain Management: Tylenol  PO (pre-op)*   Induction: Intravenous  PONV Risk Score and Plan: 1 and TIVA, Dexamethasone  and Ondansetron  Airway Management Planned: Natural Airway and Simple Face Mask  Additional Equipment:   Intra-op Plan:   Post-operative Plan:   Informed Consent: I have reviewed the patients History and Physical, chart, labs and discussed the procedure including the risks, benefits and alternatives for the proposed anesthesia with the patient or authorized representative who has indicated his/her understanding and acceptance.     Dental advisory given  Plan Discussed with: CRNA and Anesthesiologist  Anesthesia Plan  Comments:          Anesthesia Quick Evaluation

## 2024-09-05 NOTE — Anesthesia Postprocedure Evaluation (Signed)
 Anesthesia Post Note  Patient: Alexander Cohen  Procedure(s) Performed: SVT ABLATION     Patient location during evaluation: PACU Anesthesia Type: MAC Level of consciousness: awake and alert Pain management: pain level controlled Vital Signs Assessment: post-procedure vital signs reviewed and stable Respiratory status: spontaneous breathing, nonlabored ventilation, respiratory function stable and patient connected to nasal cannula oxygen Cardiovascular status: blood pressure returned to baseline and stable Postop Assessment: no apparent nausea or vomiting Anesthetic complications: no   There were no known notable events for this encounter.  Last Vitals:  Vitals:   09/05/24 1630 09/05/24 1700  BP: 117/73 125/79  Pulse: 84 81  Resp: 18 19  Temp:    SpO2: 92% 93%    Last Pain:  Vitals:   09/05/24 1454  TempSrc:   PainSc: 0-No pain                 [name]

## 2024-09-05 NOTE — Anesthesia Procedure Notes (Signed)
 Procedure Name: MAC Date/Time: 09/05/2024 12:00 PM  Performed by: Harrold Macintosh, CRNAOxygen Delivery Method: Simple face mask Placement Confirmation: positive ETCO2

## 2024-09-05 NOTE — Transfer of Care (Signed)
 Immediate Anesthesia Transfer of Care Note  Patient: Alexander Cohen  Procedure(s) Performed: SVT ABLATION  Patient Location: Cath Lab  Anesthesia Type:MAC  Level of Consciousness: awake, alert , and oriented  Airway & Oxygen Therapy: Patient Spontanous Breathing  Post-op Assessment: Report given to RN, Post -op Vital signs reviewed and stable, Patient moving all extremities X 4, and Patient able to stick tongue midline  Post vital signs: Reviewed and stable  Last Vitals:  Vitals Value Taken Time  BP 102/72 09/05/24 14:11  Temp 98.6   Pulse 87 09/05/24 14:14  Resp 12 09/05/24 14:14  SpO2 94 % 09/05/24 14:14  Vitals shown include unfiled device data.  Last Pain:  Vitals:   09/05/24 1030  TempSrc:   PainSc: 0-No pain         Complications: There were no known notable events for this encounter.

## 2024-09-05 NOTE — Progress Notes (Signed)
 Patient ambulated in the hallway and to the bathroom. Right groin dressing clean, dry, and intact. No bleeding or hematoma noted. Patient dressed for discharge, no changes noted.

## 2024-09-06 ENCOUNTER — Encounter (HOSPITAL_COMMUNITY): Payer: Self-pay | Admitting: Cardiovascular Disease

## 2024-09-06 ENCOUNTER — Telehealth (HOSPITAL_COMMUNITY): Payer: Self-pay

## 2024-09-06 MED FILL — Bupivacaine HCl Preservative Free (PF) Inj 0.25%: INTRAMUSCULAR | Qty: 30 | Status: AC

## 2024-09-06 NOTE — Telephone Encounter (Signed)
 Spoke with patient to complete post procedure follow up call.  Patient reports no complications with groin sites.   Instructions reviewed with patient:  Remove large bandage at puncture site after 24 hours. It is normal to have bruising, tenderness, mild swelling, and a pea or marble sized lump/knot at the groin site which can take up to three months to resolve.  Get help right away if you notice sudden swelling at the puncture site.  Check your puncture site every day for signs of infection: fever, redness, swelling, pus drainage, warmth, foul odor or excessive pain. If this occurs, please call [phone], to speak with the RN Navigator. Get help right away if your puncture site is bleeding and the bleeding does not stop after applying firm pressure to the area.  You may continue to have skipped beats during the first several months after your procedure.  You will follow up with the APP 4 weeks after your procedure.  Activity restrictions reviewed.  Patient verbalized understanding to all instructions provided.

## 2024-09-12 ENCOUNTER — Encounter: Payer: Self-pay | Admitting: Family Medicine

## 2024-09-12 ENCOUNTER — Ambulatory Visit: Admitting: Family Medicine

## 2024-09-12 VITALS — BP 130/80 | HR 90 | Temp 97.8°F | Resp 16 | Ht 70.75 in | Wt 214.4 lb

## 2024-09-12 DIAGNOSIS — L821 Other seborrheic keratosis: Secondary | ICD-10-CM | POA: Diagnosis not present

## 2024-09-12 DIAGNOSIS — L03818 Cellulitis of other sites: Secondary | ICD-10-CM | POA: Diagnosis not present

## 2024-09-12 DIAGNOSIS — G4700 Insomnia, unspecified: Secondary | ICD-10-CM | POA: Diagnosis not present

## 2024-09-12 DIAGNOSIS — E785 Hyperlipidemia, unspecified: Secondary | ICD-10-CM | POA: Diagnosis not present

## 2024-09-12 DIAGNOSIS — R972 Elevated prostate specific antigen [PSA]: Secondary | ICD-10-CM | POA: Insufficient documentation

## 2024-09-12 MED ORDER — TRAZODONE HCL 50 MG PO TABS: 25.0000 mg | ORAL_TABLET | Freq: Every evening | ORAL | 1 refills | Status: DC | PRN

## 2024-09-12 MED ORDER — DOXYCYCLINE HYCLATE 100 MG PO TABS: 100.0000 mg | ORAL_TABLET | Freq: Two times a day (BID) | ORAL | 0 refills | Status: AC

## 2024-09-12 NOTE — Assessment & Plan Note (Signed)
 He has not noted changes in urinary frequency and no symptoms suggestive of acute prostatitis. Prefers to hold until next visit to have PSA repeated. Monitor for new urinary symptoms.

## 2024-09-12 NOTE — Progress Notes (Signed)
 Chief Complaint  Patient presents with   Establish Care   Discussed the use of AI scribe software for clinical note transcription with the patient, who gave verbal consent to proceed.  History of Present Illness Alexander Cohen is a 68 year old male with past medical history significant for polymyalgia rheumatica, generalized OA, and hyperlipidemia, who is here today to establish care.  Former PCP: Dr Jimmy, who has recently retired. Last preventive routine visit: 12/30/23 Recently underwent cardiac ablation procedure, 09/05/2024, due to symptomatic SVTs. Since then, he has not experienced any episodes of palpitations, which previously reached heart rates of 150-190 bpm. He continues to take metoprolol  tartrate 25 mg daily as needed but has not required it in the past week.  -He has a lump in the right groin area where the catheter was inserted during above the procedure. The lump increased in size right after the procedure but has not grown since. He experiences occasional mild irritation but no significant pain or leg issues. Negative for fever, chills, changes in appetite. He has not noted drainage, numbness, or tingling of LE.  -He noticed a new dark spot on the right side of his neck after shaving for the ablation. It does not hurt, itch, or bleed.  He is not sure for how long lesion has been present. Negative for family history or personal history of skin cancer.  -Insomnia: He struggles with sleep, averaging 5 to 5.5 hours per night for years. He has no trouble falling asleep but wakes up early and cannot return to sleep. He has tried melatonin and Benadryl without success. Exercise helps, but he has not been able to go to the gym recently.  He is due for colon cancer screening and plans to schedule a colonoscopy in January after postponing it due to heart issues.  His PSA has been mildly elevated in the past, it was 4.1 in February/2025 with no urinary symptoms reported.  He  received a flu shot in late September and is up to date with pneumonia, RSV, and shingles vaccinations. He has not received a COVID vaccine this year, having had COVID twice last year.  He consumes a glass of wine daily and occasionally more on weekends. He is cautious due to a family history of alcoholism.  He has a history of bilateral knee arthritis with prior surgeries in college and is considering knee replacement surgery. He is not ready for surgery yet due to his wife's MS and his current functional status.  Lab Results  Component Value Date   NA 141 08/11/2024   CL 105 08/11/2024   K 4.5 08/11/2024   CO2 21 08/11/2024   BUN 23 08/11/2024   CREATININE 1.03 08/11/2024   EGFR 78 08/11/2024   CALCIUM 9.4 08/11/2024   PHOS 2.9 05/14/2010   ALBUMIN 3.8 05/25/2024   GLUCOSE 77 08/11/2024   Lab Results  Component Value Date   WBC 8.8 08/11/2024   HGB 15.9 08/11/2024   HCT 47.1 08/11/2024   MCV 94 08/11/2024   PLT 257 08/11/2024   Lab Results  Component Value Date   CHOL 245 (H) 12/30/2023   HDL 74.50 12/30/2023   LDLCALC 154 (H) 12/30/2023   LDLDIRECT 156.3 09/15/2013   TRIG 82.0 12/30/2023   CHOLHDL 3 12/30/2023  The 10-year ASCVD risk score (Arnett DK, et al., 2019) is: 17.2%   Values used to calculate the score:     Age: 58 years     Clincally relevant  sex: Male     Is Non-Hispanic African American: No     Diabetic: No     Tobacco smoker: No     Systolic Blood Pressure: 130 mmHg     Is BP treated: No     HDL Cholesterol: 74.5 mg/dL     Total Cholesterol: 245 mg/dL  Lab Results  Component Value Date   PSA 4.14 (H) 12/30/2023   PSA 3.03 12/23/2021   PSA 3.84 12/15/2019   Review of Systems  Constitutional:  Negative for unexpected weight change.  HENT:  Negative for mouth sores and sore throat.   Respiratory:  Negative for cough, shortness of breath and wheezing.   Cardiovascular:  Negative for chest pain, palpitations and leg swelling.  Gastrointestinal:   Negative for abdominal pain, nausea and vomiting.  Endocrine: Negative for cold intolerance and heat intolerance.  Genitourinary:  Negative for decreased urine volume, dysuria and hematuria.  Skin:  Negative for rash.  Neurological:  Negative for syncope, weakness and headaches.  Psychiatric/Behavioral:  Negative for confusion and hallucinations.   See other pertinent positives and negatives in HPI.  Current Outpatient Medications on File Prior to Visit  Medication Sig Dispense Refill   EPINEPHrine  (EPIPEN  2-PAK) 0.3 mg/0.3 mL IJ SOAJ injection INJECT 1 PEN SUBCUTANEOUSLY AS DIRECTED 1 each 1   Glycerin, PF, (OPTASE COMFORT DRY EYE) 1 % SOLN Place 1 drop into both eyes daily.     metoprolol  tartrate (LOPRESSOR ) 25 MG tablet TAKE 1 TABLET BY MOUTH 2 TIMES A DAY AS NEEDED (Patient taking differently: as needed.) 180 tablet 0   predniSONE  (DELTASONE ) 5 MG tablet Take 1 tablet (5 mg total) by mouth daily. Or as directed 90 tablet 3   No current facility-administered medications on file prior to visit.   Past Medical History:  Diagnosis Date   ADHD (attention deficit hyperactivity disorder), inattentive type    Anxiety    Hyperlipidemia    Nasal polyp    Osteoarthritis    Allergies  Allergen Reactions   Other Anaphylaxis    Pine Nuts    Family History  Problem Relation Age of Onset   Alcohol abuse Mother    Alcohol abuse Father    Colon polyps Sister 68   Diabetes Maternal Grandmother    Colon cancer Neg Hx    Esophageal cancer Neg Hx    Prostate cancer Neg Hx    Stomach cancer Neg Hx    Rectal cancer Neg Hx    Social History   Socioeconomic History   Marital status: Married    Spouse name: Not on file   Number of children: 3   Years of education: Not on file   Highest education level: Master's degree (e.g., MA, MS, MEng, MEd, MSW, MBA)  Occupational History   Occupation: Principal---retired    Associate Professor: [employer]    Comment: Engineer, [employer]     Comment:     Tobacco Use   Smoking status: Never    Passive exposure: Past   Smokeless tobacco: Never  Vaping Use   Vaping status: Never Used  Substance and Sexual Activity   Alcohol use: Not Currently    Alcohol/week: 9.0 standard drinks of alcohol    Types: 9 Glasses of wine per week   Drug use: Not Currently   Sexual activity: Yes  Other Topics Concern   Not on file  Social History Narrative   Has living will   Wife, then son Alexander Cohen, is health care POA  Would accept resuscitation   No tube feeds if cognitively unaware   Social Drivers of Health   Financial Resource Strain: Low Risk  (09/11/2024)   Overall Financial Resource Strain (CARDIA)    Difficulty of Paying Living Expenses: Not hard at all  Food Insecurity: No Food Insecurity (09/11/2024)   Hunger Vital Sign    Worried About Running Out of Food in the Last Year: Never true    Ran Out of Food in the Last Year: Never true  Transportation Needs: No Transportation Needs (09/11/2024)   PRAPARE - Administrator, Civil Service (Medical): No    Lack of Transportation (Non-Medical): No  Physical Activity: Sufficiently Active (09/11/2024)   Exercise Vital Sign    Days of Exercise per Week: 6 days    Minutes of Exercise per Session: 60 min  Stress: No Stress Concern Present (09/11/2024)   [employer] of Occupational Health - Occupational Stress Questionnaire    Feeling of Stress: Only a little  Social Connections: Socially Integrated (09/11/2024)   Social Connection and Isolation Panel    Frequency of Communication with Friends and Family: Twice a week    Frequency of Social Gatherings with Friends and Family: Once a week    Attends Religious Services: More than 4 times per year    Active Member of [employer] or Organizations: Yes    Attends Banker Meetings: 1 to 4 times per year    Marital Status: Married   Vitals:   09/12/24 0800  BP: 130/80  Pulse: 90  Resp: 16  Temp: 97.8 F (36.6 C)  SpO2: 96%    Body mass index is 30.11 kg/m.  Physical Exam Vitals and nursing note reviewed.  Constitutional:      General: He is not in acute distress.    Appearance: He is well-developed.  HENT:     Head: Normocephalic and atraumatic.     Mouth/Throat:     Mouth: Mucous membranes are moist.     Pharynx: Oropharynx is clear.  Eyes:     Conjunctiva/sclera: Conjunctivae normal.  Cardiovascular:     Rate and Rhythm: Normal rate and regular rhythm.     Pulses:          Dorsalis pedis pulses are 2+ on the right side and 2+ on the left side.     Heart sounds: No murmur heard. Pulmonary:     Effort: Pulmonary effort is normal. No respiratory distress.     Breath sounds: Normal breath sounds.  Abdominal:     Palpations: Abdomen is soft. There is no hepatomegaly or mass.     Tenderness: There is no abdominal tenderness.  Lymphadenopathy:     Cervical: No cervical adenopathy.  Skin:    General: Skin is warm.     Findings: Erythema and lesion present. No rash.         Comments: See pictures.  Neurological:     Mental Status: He is alert and oriented to person, place, and time.     Cranial Nerves: No cranial nerve deficit.     Gait: Gait normal.  Psychiatric:        Mood and Affect: Mood and affect normal.   Right inguinal area:   Right side of neck:   ASSESSMENT AND PLAN:  Mr. Ananda Sitzer was seen today for establish care.  Diagnoses and all orders for this visit:  Cellulitis of other specified site No fluctuant area. Recommend course of oral abx, Doxycycline  x 7  d, some side effects discussed. Continue monitoring for changes or worsening pain. Local heat may help. Keep appt with cardiologist, s/p cardiac ablation. Instructed about warning signs.  -     Doxycycline  Hyclate; Take 1 tablet (100 mg total) by mouth 2 (two) times daily for 7 days.  Dispense: 14 tablet; Refill: 0  Insomnia, unspecified type Assessment & Plan: Chronic, OTC medications have not helped. We  discussed pharmacological options. He agrees with trying trazodone 50 mg 1/2 to 1 tablet daily as needed at bedtime. We discussed some side effects. Continue good sleep hygiene. Follow-up in 12/2024.  Orders: -     traZODone HCl; Take 0.5-1 tablets (25-50 mg total) by mouth at bedtime as needed for sleep.  Dispense: 30 tablet; Refill: 1  Hyperlipidemia, unspecified hyperlipidemia type Assessment & Plan: Currently on nonpharmacologic treatment. 10 years ASCVD risk 17.2%. He prefers to hold until 12/2024 to have fasting labs done. Continue low-fat diet.  Seborrheic keratosis Assessment & Plan: Lesion of concerned lateral, right-sided neck and some other scattered around area. Educated about diagnosis, prognosis, and treatment options. Continue monitoring for changes.  Elevated PSA Assessment & Plan: He has not noted changes in urinary frequency and no symptoms suggestive of acute prostatitis. Prefers to hold until next visit to have PSA repeated. Monitor for new urinary symptoms.  Return in about 3 months (around 12/13/2024) for CPE.  Caliph Borowiak G. Eldonna Neuenfeldt, MD  [facility]. [place] office.

## 2024-09-12 NOTE — Assessment & Plan Note (Addendum)
 Currently on nonpharmacologic treatment. 10 years ASCVD risk 17.2%. He prefers to hold until 12/2024 to have fasting labs done. Continue low-fat diet.

## 2024-09-12 NOTE — Patient Instructions (Addendum)
 A few things to remember from today's visit:  Elevated PSA  Insomnia, unspecified type - Plan: traZODone (DESYREL) 50 MG tablet  Hyperlipidemia, unspecified hyperlipidemia type Trazodone started today for sleep. No other change today. Will plan on repeating labs next viist.  If you need refills for medications you take chronically, please call your pharmacy. Do not use My Chart to request refills or for acute issues that need immediate attention. If you send a my chart message, it may take a few days to be addressed, specially if I am not in the office.  Please be sure medication list is accurate. If a new problem present, please set up appointment sooner than planned today.

## 2024-09-12 NOTE — Assessment & Plan Note (Signed)
 Chronic, OTC medications have not helped. We discussed pharmacological options. He agrees with trying trazodone 50 mg 1/2 to 1 tablet daily as needed at bedtime. We discussed some side effects. Continue good sleep hygiene. Follow-up in 12/2024.

## 2024-09-12 NOTE — Assessment & Plan Note (Signed)
 Lesion of concerned lateral, right-sided neck and some other scattered around area. Educated about diagnosis, prognosis, and treatment options. Continue monitoring for changes.

## 2024-10-05 ENCOUNTER — Ambulatory Visit: Attending: Student | Admitting: Student

## 2024-10-05 ENCOUNTER — Encounter: Payer: Self-pay | Admitting: Student

## 2024-10-05 VITALS — BP 138/80 | HR 88 | Ht 70.75 in | Wt 211.2 lb

## 2024-10-05 DIAGNOSIS — I471 Supraventricular tachycardia, unspecified: Secondary | ICD-10-CM

## 2024-10-05 NOTE — Progress Notes (Signed)
  Electrophysiology Office Note:   Date:  10/05/2024  ID:  Alexander Cohen, DOB [birth year], MRN [id number]  Primary Cardiologist: None Electrophysiologist: Eulas FORBES Furbish, MD   Electrophysiologist:  Eulas FORBES Furbish, MD      History of Present Illness:   Alexander Cohen is a 68 y.o. male with h/o SVT seen today for routine electrophysiology follow-up s/p Ablation.  Since last being seen in our clinic the patient reports doing very well. No clear breakthrough arrhythmia of which he is aware similar to his prior which were in the 150-180 range. He has had 2 episodes of brief palpitations with HRs in the 120s, but resolved quickly without intervention. Groin healed without issue. Otherwise, he denies chest pain, dyspnea, syncope, edema, weight gain, or early satiety.    Review of systems complete and found to be negative unless listed in HPI.   EP Information / Studies Reviewed:    EKG is ordered today. Personal review as below.  EKG Interpretation Date/Time:  Thursday October 05 2024 11:15:58 EST Ventricular Rate:  88 PR Interval:  156 QRS Duration:  134 QT Interval:  406 QTC Calculation: 491 R Axis:   -55  Text Interpretation: Normal sinus rhythm Left axis deviation Right bundle branch block Confirmed by Lesia Sharper [phone]) on 10/05/2024 11:19:50 AM    Arrhythmia/Device History S/p SVT ablation of 2 separate foci 09/05/2024   Physical Exam:   VS:  BP 138/80   Pulse 88   Ht 5' 10.75 (1.797 m)   Wt 211 lb 3.2 oz (95.8 kg)   SpO2 96%   BMI 29.66 kg/m    Wt Readings from Last 3 Encounters:  10/05/24 211 lb 3.2 oz (95.8 kg)  09/12/24 214 lb 6.4 oz (97.3 kg)  09/05/24 210 lb (95.3 kg)     GEN: No acute distress NECK: No JVD; No carotid bruits CARDIAC: Regular rate and rhythm, no murmurs, rubs, gallops RESPIRATORY:  Clear to auscultation without rales, wheezing or rhonchi  ABDOMEN: Soft, non-tender, non-distended EXTREMITIES:  No edema; No deformity    ASSESSMENT AND PLAN:    SVT S/p SVT ablation of 2 separate foci 09/05/2024 No obvious recurrence, 2 very brief episodes of palpitations but HRs in 120s, not 150-180s as before Continue lopressor  25 mg as needed     Follow up with EP Team in 6 months  Signed, Sharper Prentice Lesia, PA-C

## 2024-10-05 NOTE — Patient Instructions (Signed)
 Medication Instructions:  No medication changes today. *If you need a refill on your cardiac medications before your next appointment, please call your pharmacy*  Lab Work: No labwork ordered today. If you have labs (blood work) drawn today and your tests are completely normal, you will receive your results only by: MyChart Message (if you have MyChart) OR A paper copy in the mail If you have any lab test that is abnormal or we need to change your treatment, we will call you to review the results.  Testing/Procedures: No testing ordered today  Follow-Up: At [facility], you and your health needs are our priority.  As part of our continuing mission to provide you with exceptional heart care, our providers are all part of one team.  This team includes your primary Cardiologist (physician) and Advanced Practice Providers or APPs (Physician Assistants and Nurse Practitioners) who all work together to provide you with the care you need, when you need it.  Your next appointment:   6 month(s)  Provider:   You may see Efraim Grange, MD or one of the following Advanced Practice Providers on your designated Care Team:   Mertha Abrahams, [place] [address] [place], PA-C Suzann Riddle, NP Creighton Doffing, NP    We recommend signing up for the patient portal called "MyChart".  Sign up information is provided on this After Visit Summary.  MyChart is used to connect with patients for Virtual Visits (Telemedicine).  Patients are able to view lab/test results, encounter notes, upcoming appointments, etc.  Non-urgent messages can be sent to your provider as well.   To learn more about what you can do with MyChart, go to [email or web address].

## 2024-11-07 DIAGNOSIS — G4700 Insomnia, unspecified: Secondary | ICD-10-CM

## 2024-11-07 MED ORDER — TRAZODONE HCL 50 MG PO TABS: ORAL_TABLET | ORAL | 1 refills | Status: AC
